# Patient Record
Sex: Male | Born: 1955
Health system: Southern US, Community
[De-identification: ages and names within clinical notes are randomized; demographics above are authoritative.]

## PROBLEM LIST (undated history)

## (undated) DIAGNOSIS — E785 Hyperlipidemia, unspecified: Secondary | ICD-10-CM

## (undated) DIAGNOSIS — G4730 Sleep apnea, unspecified: Secondary | ICD-10-CM

## (undated) DIAGNOSIS — E039 Hypothyroidism, unspecified: Secondary | ICD-10-CM

## (undated) DIAGNOSIS — F32A Depression, unspecified: Secondary | ICD-10-CM

## (undated) DIAGNOSIS — N200 Calculus of kidney: Secondary | ICD-10-CM

## (undated) DIAGNOSIS — F329 Major depressive disorder, single episode, unspecified: Secondary | ICD-10-CM

## (undated) DIAGNOSIS — S86019A Strain of unspecified Achilles tendon, initial encounter: Secondary | ICD-10-CM

## (undated) DIAGNOSIS — K219 Gastro-esophageal reflux disease without esophagitis: Secondary | ICD-10-CM

## (undated) DIAGNOSIS — M1990 Unspecified osteoarthritis, unspecified site: Secondary | ICD-10-CM

## (undated) DIAGNOSIS — F419 Anxiety disorder, unspecified: Secondary | ICD-10-CM

## (undated) DIAGNOSIS — I10 Essential (primary) hypertension: Secondary | ICD-10-CM

## (undated) DIAGNOSIS — R109 Unspecified abdominal pain: Secondary | ICD-10-CM

## (undated) DIAGNOSIS — G4700 Insomnia, unspecified: Secondary | ICD-10-CM

## (undated) HISTORY — DX: Unspecified osteoarthritis, unspecified site: M19.90

## (undated) HISTORY — DX: Essential (primary) hypertension: I10

## (undated) HISTORY — DX: Gastro-esophageal reflux disease without esophagitis: K21.9

## (undated) HISTORY — DX: Depression, unspecified: F32.A

## (undated) HISTORY — DX: Sleep apnea, unspecified: G47.30

## (undated) HISTORY — PX: TONSILLECTOMY: SUR1361

## (undated) HISTORY — DX: Insomnia, unspecified: G47.00

## (undated) HISTORY — DX: Hypothyroidism, unspecified: E03.9

## (undated) HISTORY — DX: Major depressive disorder, single episode, unspecified: F32.9

## (undated) HISTORY — DX: Unspecified abdominal pain: R10.9

## (undated) HISTORY — DX: Calculus of kidney: N20.0

## (undated) HISTORY — DX: Strain of unspecified achilles tendon, initial encounter: S86.019A

## (undated) HISTORY — DX: Anxiety disorder, unspecified: F41.9

## (undated) HISTORY — PX: LITHOTRIPSY: SUR834

## (undated) HISTORY — PX: COLONOSCOPY: SHX174

## (undated) HISTORY — DX: Hyperlipidemia, unspecified: E78.5

## (undated) HISTORY — PX: THYROIDECTOMY: SHX17

---

## 2004-01-28 ENCOUNTER — Emergency Department (HOSPITAL_COMMUNITY): Admission: EM | Admit: 2004-01-28 | Discharge: 2004-01-28 | Payer: Self-pay

## 2004-02-15 ENCOUNTER — Ambulatory Visit (HOSPITAL_COMMUNITY): Admission: RE | Admit: 2004-02-15 | Discharge: 2004-02-15 | Payer: Self-pay | Admitting: Urology

## 2004-09-28 IMAGING — CR DG ABDOMEN 1V
1 series · 1 of 1 positions shown · non-contrast
Comparison: none

CLINICAL DATA: Right ureteropelvic junction stone.
 SINGLE-VIEW ABDOMEN
 A single view of the abdomen is made and compared to a previous CT scan of 01/28/04 and shows again a stone measuring approximately 3 x 7 mm in the region of the right ureteropelvic junction.  There is a left pelvic phlebolith seen.  The kidneys appear normal in size and contour.  The abdominal gas pattern and bones of the pelvis and lumbosacral spine appear normal in this single view. 
 IMPRESSION
 Right ureteropelvic junction stone appears to be in approximately the same place it was on the CT scan of 01/28/04.

[view not recorded]
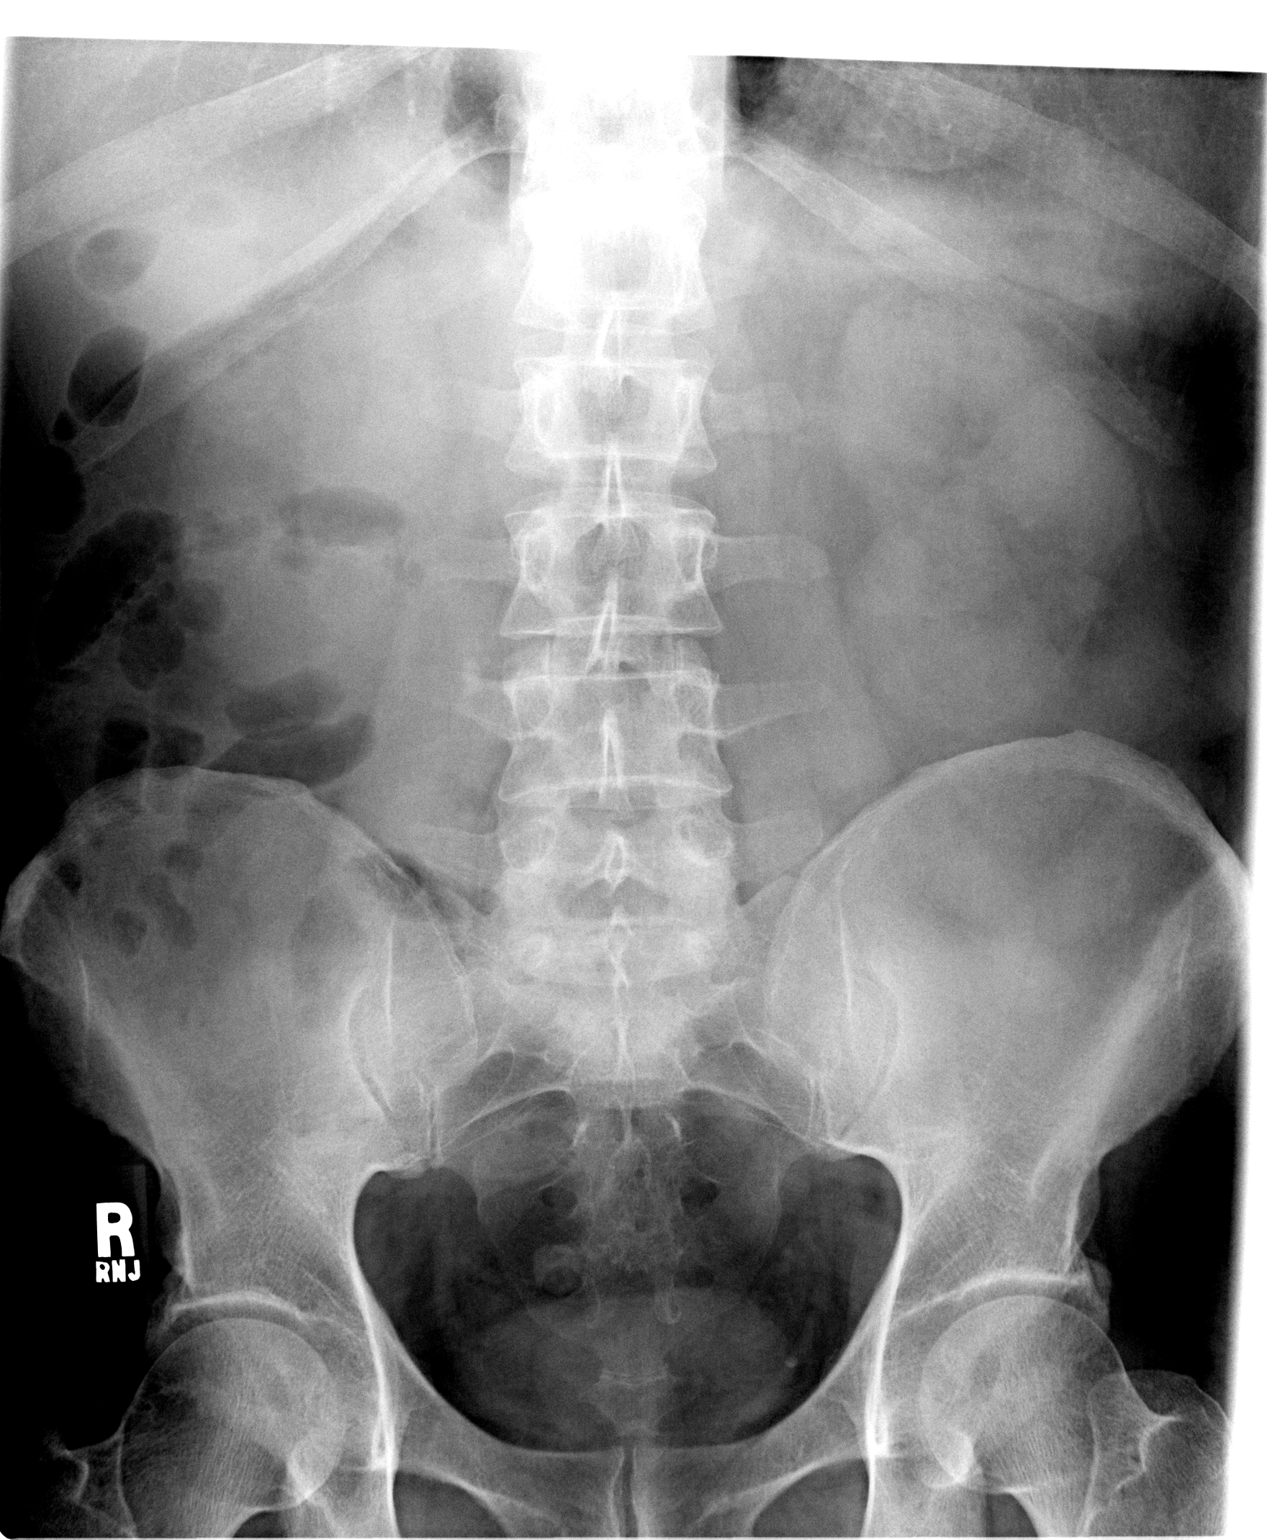

[1 of 1 positions shown; findings below may reference images not displayed]

## 2004-10-11 ENCOUNTER — Ambulatory Visit: Payer: Self-pay | Admitting: Family Medicine

## 2004-10-22 ENCOUNTER — Ambulatory Visit: Payer: Self-pay | Admitting: Family Medicine

## 2004-11-06 ENCOUNTER — Ambulatory Visit: Payer: Self-pay | Admitting: Family Medicine

## 2004-11-08 ENCOUNTER — Ambulatory Visit: Payer: Self-pay | Admitting: Endocrinology

## 2004-11-29 ENCOUNTER — Ambulatory Visit (HOSPITAL_COMMUNITY): Admission: RE | Admit: 2004-11-29 | Discharge: 2004-11-29 | Payer: Self-pay | Admitting: Endocrinology

## 2005-03-27 ENCOUNTER — Ambulatory Visit: Payer: Self-pay | Admitting: Family Medicine

## 2006-02-11 ENCOUNTER — Ambulatory Visit: Payer: Self-pay | Admitting: Family Medicine

## 2006-02-17 ENCOUNTER — Ambulatory Visit: Payer: Self-pay | Admitting: Family Medicine

## 2006-03-04 ENCOUNTER — Ambulatory Visit: Payer: Self-pay | Admitting: Family Medicine

## 2006-03-10 ENCOUNTER — Ambulatory Visit: Payer: Self-pay | Admitting: Cardiology

## 2006-04-16 ENCOUNTER — Ambulatory Visit (HOSPITAL_COMMUNITY): Admission: RE | Admit: 2006-04-16 | Discharge: 2006-04-16 | Payer: Self-pay | Admitting: Urology

## 2007-05-05 ENCOUNTER — Ambulatory Visit: Payer: Self-pay | Admitting: Family Medicine

## 2007-05-05 LAB — CONVERTED CEMR LAB
ALT: 41 units/L — ABNORMAL HIGH (ref 0–40)
Albumin: 4.2 g/dL (ref 3.5–5.2)
Basophils Absolute: 0 10*3/uL (ref 0.0–0.1)
CO2: 32 meq/L (ref 19–32)
Chloride: 109 meq/L (ref 96–112)
Cholesterol: 200 mg/dL (ref 0–200)
Creatinine, Ser: 0.9 mg/dL (ref 0.4–1.5)
GFR calc Af Amer: 115 mL/min
GFR calc non Af Amer: 95 mL/min
HCT: 46.1 % (ref 39.0–52.0)
HDL: 36.5 mg/dL — ABNORMAL LOW (ref >39.0)
LDL Cholesterol: 135 mg/dL — ABNORMAL HIGH (ref 0–99)
Lymphocytes Relative: 35.5 % (ref 12.0–46.0)
Neutro Abs: 3.5 10*3/uL (ref 1.4–7.7)
Potassium: 4 meq/L (ref 3.5–5.1)
RDW: 12.1 % (ref 11.5–14.6)
Sodium: 144 meq/L (ref 135–145)
TSH: 1.15 microintl units/mL (ref 0.35–5.50)
Total Bilirubin: 1.2 mg/dL (ref 0.3–1.2)
VLDL: 28 mg/dL (ref 0–40)
WBC: 6.4 10*3/uL (ref 4.5–10.5)

## 2007-05-11 ENCOUNTER — Ambulatory Visit: Payer: Self-pay | Admitting: Family Medicine

## 2007-06-21 ENCOUNTER — Encounter: Payer: Self-pay | Admitting: Family Medicine

## 2007-06-21 ENCOUNTER — Ambulatory Visit (HOSPITAL_BASED_OUTPATIENT_CLINIC_OR_DEPARTMENT_OTHER): Admission: RE | Admit: 2007-06-21 | Discharge: 2007-06-21 | Payer: Self-pay | Admitting: Family Medicine

## 2007-06-30 ENCOUNTER — Ambulatory Visit: Payer: Self-pay | Admitting: Pulmonary Disease

## 2007-07-14 ENCOUNTER — Encounter: Payer: Self-pay | Admitting: Family Medicine

## 2007-07-20 ENCOUNTER — Telehealth (INDEPENDENT_AMBULATORY_CARE_PROVIDER_SITE_OTHER): Payer: Self-pay | Admitting: *Deleted

## 2007-07-28 ENCOUNTER — Telehealth: Payer: Self-pay | Admitting: Family Medicine

## 2007-08-11 ENCOUNTER — Ambulatory Visit: Payer: Self-pay | Admitting: Pulmonary Disease

## 2007-08-18 ENCOUNTER — Telehealth: Payer: Self-pay | Admitting: Family Medicine

## 2007-08-19 ENCOUNTER — Ambulatory Visit: Payer: Self-pay | Admitting: Family Medicine

## 2007-08-19 DIAGNOSIS — E039 Hypothyroidism, unspecified: Secondary | ICD-10-CM

## 2007-08-19 DIAGNOSIS — J209 Acute bronchitis, unspecified: Secondary | ICD-10-CM

## 2007-08-19 DIAGNOSIS — I10 Essential (primary) hypertension: Secondary | ICD-10-CM

## 2007-08-19 DIAGNOSIS — F329 Major depressive disorder, single episode, unspecified: Secondary | ICD-10-CM

## 2007-08-19 DIAGNOSIS — E785 Hyperlipidemia, unspecified: Secondary | ICD-10-CM

## 2007-08-19 DIAGNOSIS — F419 Anxiety disorder, unspecified: Secondary | ICD-10-CM | POA: Insufficient documentation

## 2007-08-19 DIAGNOSIS — Z87442 Personal history of urinary calculi: Secondary | ICD-10-CM

## 2007-08-30 ENCOUNTER — Encounter: Payer: Self-pay | Admitting: Family Medicine

## 2007-09-13 ENCOUNTER — Ambulatory Visit: Payer: Self-pay | Admitting: Pulmonary Disease

## 2007-11-09 ENCOUNTER — Telehealth: Payer: Self-pay | Admitting: Family Medicine

## 2007-11-10 ENCOUNTER — Encounter: Payer: Self-pay | Admitting: Family Medicine

## 2008-05-30 ENCOUNTER — Telehealth: Payer: Self-pay | Admitting: Family Medicine

## 2008-06-06 ENCOUNTER — Telehealth: Payer: Self-pay | Admitting: Family Medicine

## 2008-06-23 ENCOUNTER — Ambulatory Visit: Payer: Self-pay | Admitting: Family Medicine

## 2008-06-23 LAB — CONVERTED CEMR LAB
Blood in Urine, dipstick: NEGATIVE
Glucose, Urine, Semiquant: NEGATIVE
Ketones, urine, test strip: NEGATIVE
Nitrite: NEGATIVE
Specific Gravity, Urine: 1.015
Urobilinogen, UA: 0.2
WBC Urine, dipstick: NEGATIVE
pH: 6.5

## 2008-07-13 ENCOUNTER — Ambulatory Visit: Payer: Self-pay | Admitting: Family Medicine

## 2008-07-13 DIAGNOSIS — K219 Gastro-esophageal reflux disease without esophagitis: Secondary | ICD-10-CM

## 2008-07-13 DIAGNOSIS — G4700 Insomnia, unspecified: Secondary | ICD-10-CM

## 2008-07-13 DIAGNOSIS — M940 Chondrocostal junction syndrome [Tietze]: Secondary | ICD-10-CM

## 2008-07-13 DIAGNOSIS — K6289 Other specified diseases of anus and rectum: Secondary | ICD-10-CM

## 2008-07-13 LAB — CONVERTED CEMR LAB
ALT: 69 units/L — ABNORMAL HIGH (ref 0–53)
BUN: 14 mg/dL (ref 6–23)
Calcium: 9.2 mg/dL (ref 8.4–10.5)
Chloride: 107 meq/L (ref 96–112)
Cholesterol: 152 mg/dL (ref 0–200)
Creatinine, Ser: 0.9 mg/dL (ref 0.4–1.5)
Eosinophils Absolute: 0.1 10*3/uL (ref 0.0–0.7)
Eosinophils Relative: 2.2 % (ref 0.0–5.0)
GFR calc non Af Amer: 95 mL/min
Glucose, Bld: 89 mg/dL (ref 70–99)
HDL: 31.7 mg/dL — ABNORMAL LOW (ref >39.0)
Hemoglobin: 16.3 g/dL (ref 13.0–17.0)
Lymphocytes Relative: 39.5 % (ref 12.0–46.0)
MCV: 94.1 fL (ref 78.0–100.0)
Monocytes Absolute: 0.3 10*3/uL (ref 0.1–1.0)
Neutro Abs: 2.9 10*3/uL (ref 1.4–7.7)
Platelets: 226 10*3/uL (ref 150–400)
Potassium: 3.8 meq/L (ref 3.5–5.1)
Sodium: 143 meq/L (ref 135–145)
TSH: 0.67 microintl units/mL (ref 0.35–5.50)
VLDL: 23 mg/dL (ref 0–40)

## 2008-07-26 ENCOUNTER — Ambulatory Visit: Payer: Self-pay | Admitting: Gastroenterology

## 2008-07-27 ENCOUNTER — Telehealth: Payer: Self-pay | Admitting: Gastroenterology

## 2008-07-27 ENCOUNTER — Encounter: Payer: Self-pay | Admitting: Gastroenterology

## 2008-08-09 ENCOUNTER — Ambulatory Visit: Payer: Self-pay | Admitting: Gastroenterology

## 2008-08-09 LAB — HM COLONOSCOPY

## 2009-02-13 ENCOUNTER — Ambulatory Visit: Payer: Self-pay | Admitting: Family Medicine

## 2009-02-13 DIAGNOSIS — S96819A Strain of other specified muscles and tendons at ankle and foot level, unspecified foot, initial encounter: Secondary | ICD-10-CM

## 2009-02-13 DIAGNOSIS — S93499A Sprain of other ligament of unspecified ankle, initial encounter: Secondary | ICD-10-CM | POA: Insufficient documentation

## 2009-07-11 ENCOUNTER — Ambulatory Visit: Payer: Self-pay | Admitting: Family Medicine

## 2009-07-26 LAB — CONVERTED CEMR LAB
ALT: 66 units/L — ABNORMAL HIGH (ref 0–53)
AST: 37 units/L (ref 0–37)
Alkaline Phosphatase: 68 units/L (ref 39–117)
BUN: 15 mg/dL (ref 6–23)
Basophils Relative: 0.3 % (ref 0.0–3.0)
CO2: 30 meq/L (ref 19–32)
Chloride: 107 meq/L (ref 96–112)
Eosinophils Absolute: 0.2 10*3/uL (ref 0.0–0.7)
GFR calc non Af Amer: 74.67 mL/min (ref >60)
HCT: 47.4 % (ref 39.0–52.0)
Hemoglobin: 16.4 g/dL (ref 13.0–17.0)
LDL Cholesterol: 104 mg/dL — ABNORMAL HIGH (ref 0–99)
Lymphs Abs: 2.3 10*3/uL (ref 0.7–4.0)
MCHC: 34.5 g/dL (ref 30.0–36.0)
MCV: 95.8 fL (ref 78.0–100.0)
Monocytes Absolute: 0.4 10*3/uL (ref 0.1–1.0)
Neutrophils Relative %: 53.6 % (ref 43.0–77.0)
Platelets: 196 10*3/uL (ref 150.0–400.0)
Sodium: 143 meq/L (ref 135–145)
TSH: 1.17 microintl units/mL (ref 0.35–5.50)
Total Bilirubin: 1.9 mg/dL — ABNORMAL HIGH (ref 0.3–1.2)
VLDL: 20.4 mg/dL (ref 0.0–40.0)
WBC: 6.2 10*3/uL (ref 4.5–10.5)

## 2009-08-01 ENCOUNTER — Ambulatory Visit: Payer: Self-pay | Admitting: Family Medicine

## 2009-08-01 LAB — CONVERTED CEMR LAB
Bilirubin Urine: NEGATIVE
Glucose, Urine, Semiquant: NEGATIVE
Ketones, urine, test strip: NEGATIVE
Nitrite: NEGATIVE
Specific Gravity, Urine: 1.02
Urobilinogen, UA: 0.2
WBC Urine, dipstick: NEGATIVE
pH: 5

## 2009-08-06 ENCOUNTER — Telehealth: Payer: Self-pay | Admitting: Family Medicine

## 2009-08-08 ENCOUNTER — Telehealth: Payer: Self-pay | Admitting: Family Medicine

## 2009-09-05 ENCOUNTER — Telehealth: Payer: Self-pay | Admitting: Family Medicine

## 2009-12-12 ENCOUNTER — Ambulatory Visit: Payer: Self-pay | Admitting: Family Medicine

## 2009-12-12 DIAGNOSIS — M479 Spondylosis, unspecified: Secondary | ICD-10-CM

## 2009-12-14 ENCOUNTER — Ambulatory Visit: Payer: Self-pay | Admitting: Family Medicine

## 2009-12-17 ENCOUNTER — Telehealth: Payer: Self-pay | Admitting: Family Medicine

## 2010-07-16 ENCOUNTER — Ambulatory Visit: Payer: Self-pay | Admitting: Family Medicine

## 2010-07-16 ENCOUNTER — Encounter: Payer: Self-pay | Admitting: Family Medicine

## 2010-07-16 DIAGNOSIS — R0602 Shortness of breath: Secondary | ICD-10-CM

## 2010-07-16 DIAGNOSIS — R002 Palpitations: Secondary | ICD-10-CM

## 2010-07-16 LAB — CONVERTED CEMR LAB
Albumin: 4.4 g/dL (ref 3.5–5.2)
Basophils Absolute: 0 10*3/uL (ref 0.0–0.1)
Basophils Relative: 0.5 % (ref 0.0–3.0)
Bilirubin, Direct: 0.2 mg/dL (ref 0.0–0.3)
Calcium: 9.2 mg/dL (ref 8.4–10.5)
Eosinophils Absolute: 0.2 10*3/uL (ref 0.0–0.7)
Eosinophils Relative: 2.1 % (ref 0.0–5.0)
Glucose, Bld: 75 mg/dL (ref 70–99)
HCT: 48.2 % (ref 39.0–52.0)
Lymphocytes Relative: 33.4 % (ref 12.0–46.0)
Lymphs Abs: 2.6 10*3/uL (ref 0.7–4.0)
MCV: 95.4 fL (ref 78.0–100.0)
Monocytes Absolute: 0.5 10*3/uL (ref 0.1–1.0)
Monocytes Relative: 5.9 % (ref 3.0–12.0)
Neutro Abs: 4.6 10*3/uL (ref 1.4–7.7)
Potassium: 4.8 meq/L (ref 3.5–5.1)
RDW: 13.4 % (ref 11.5–14.6)
Sodium: 139 meq/L (ref 135–145)
Total CHOL/HDL Ratio: 5
Triglycerides: 167 mg/dL — ABNORMAL HIGH (ref 0.0–149.0)
VLDL: 33.4 mg/dL (ref 0.0–40.0)

## 2010-08-28 ENCOUNTER — Ambulatory Visit: Payer: Self-pay | Admitting: Cardiovascular Disease

## 2010-10-29 ENCOUNTER — Ambulatory Visit: Payer: Self-pay | Admitting: Family Medicine

## 2010-10-29 LAB — CONVERTED CEMR LAB
Bilirubin Urine: NEGATIVE
Glucose, Urine, Semiquant: NEGATIVE
Specific Gravity, Urine: 1.025
pH: 5

## 2010-11-18 LAB — CONVERTED CEMR LAB
ALT: 76 units/L — ABNORMAL HIGH (ref 0–53)
AST: 40 units/L — ABNORMAL HIGH (ref 0–37)
Albumin: 4.5 g/dL (ref 3.5–5.2)
BUN: 15 mg/dL (ref 6–23)
Chloride: 104 meq/L (ref 96–112)
Cholesterol: 183 mg/dL (ref 0–200)
Eosinophils Relative: 3 % (ref 0.0–5.0)
Glucose, Bld: 85 mg/dL (ref 70–99)
HCT: 48 % (ref 39.0–52.0)
Hemoglobin: 16.5 g/dL (ref 13.0–17.0)
Lymphs Abs: 2.6 10*3/uL (ref 0.7–4.0)
MCV: 94.9 fL (ref 78.0–100.0)
Monocytes Absolute: 0.4 10*3/uL (ref 0.1–1.0)
Neutro Abs: 3.8 10*3/uL (ref 1.4–7.7)
PSA: 1.05 ng/mL (ref 0.10–4.00)
Platelets: 208 10*3/uL (ref 150.0–400.0)
Potassium: 4.7 meq/L (ref 3.5–5.1)
RDW: 13 % (ref 11.5–14.6)
Sodium: 142 meq/L (ref 135–145)
TSH: 2.05 microintl units/mL (ref 0.35–5.50)
Total Bilirubin: 1.5 mg/dL — ABNORMAL HIGH (ref 0.3–1.2)

## 2010-11-19 ENCOUNTER — Encounter: Payer: Self-pay | Admitting: Family Medicine

## 2010-11-21 ENCOUNTER — Ambulatory Visit
Admission: RE | Admit: 2010-11-21 | Discharge: 2010-11-21 | Payer: Self-pay | Source: Home / Self Care | Attending: Family Medicine | Admitting: Family Medicine

## 2010-11-21 DIAGNOSIS — H60399 Other infective otitis externa, unspecified ear: Secondary | ICD-10-CM | POA: Insufficient documentation

## 2010-11-22 ENCOUNTER — Telehealth: Payer: Self-pay | Admitting: Family Medicine

## 2010-12-07 ENCOUNTER — Encounter: Payer: Self-pay | Admitting: Endocrinology

## 2010-12-17 NOTE — Progress Notes (Signed)
Summary: diagnosis  Phone Note Call from Patient   Caller: Patient Call For: Judithann Sheen MD Summary of Call: [phone] Pt given his diagnosis so he can research it online.  Initial call taken by: Lynann Beaver CMA,  December 17, 2009 9:40 AM

## 2010-12-17 NOTE — Assessment & Plan Note (Signed)
Summary: CHEST CONGESTION/NJR   Vital Signs:  Patient profile:   55 year old male Height:      68.5 inches (173.99 cm) Weight:      195 pounds (88.64 kg) O2 Sat:      98 % on Room air Temp:     98.8 degrees F (37.11 degrees C) oral Pulse rate:   89 / minute BP sitting:   132 / 82  (left arm)  Vitals Entered By: Josph Macho RMA (July 16, 2010 11:51 AM)  O2 Flow:  Room air CC: SOB/ Chest congestion/ CF, Back Pain Is Patient Diabetic? No   History of Present Illness: Patient in today for evaluation of some episodes of a sense of SOB. These episodes have been notable for about a week. they occur while he is at his desk at work. They have never awoken him at night. He reports getting a sense he is not getting enough breath so then he pays attention and he notes then that he can feel his heart flutter and skip a beat at times as well. He is denying CP/nausea/diaphoresis. He is a nonsmoker, but he does have a brother in his 22s who has already had cardiac stenting. That brother is morbidly obese and smoke. The patient himself works at a very hi stress job for [employer] and notes stress has been worse lately. He works long hours and often gets home late and eats, then goes to bed in short order. He denies any recent heartburn or dyspepsia. He has multiple concerning risk factors for heart disease including Sleep Apnea using CPAP, hypothyroidism, HTN, hyperlipidemia, sedentary lifestyle, hi stress job, elevated BMI. He also notes he sleeps less than 7 hours nightly and has trouble falling asleep. He has taken Ambien CR in past and it helps him fall asleep but leaves him groggy in the am. No recent illness/f/c/HA/congestion/rhinorrhea/pharyngitis/GI or GU c/o.   Current Medications (verified): 1)  Tramadol Hcl 50 Mg  Tabs (Tramadol Hcl) .... Take 1-2 Tabs Q4-6h As Needed Pain 2)  Synthroid 112 Mcg Tabs (Levothyroxine Sodium) .Marland Kitchen.. 1 By Mouth Once Daily 3)  Zocor 80 Mg Tabs (Simvastatin) .Marland Kitchen.. 1 By  Mouth Once Daily At Bedtime 4)  Diovan 80 Mg Tabs (Valsartan) .Marland Kitchen.. 1 By Mouth Once Daily 5)  Adult Aspirin Ec Low Strength 81 Mg  Tbec (Aspirin) .Marland Kitchen.. 1 By Mouth Once Daily 6)  Diclofenac Sodium 75 Mg  Tbec (Diclofenac Sodium) .Marland Kitchen.. 1 By Mouth Two Times A Day After Meals 7)  Analpram-Hc Singles 1-2.5 % Crea (Hydrocortisone Ace-Pramoxine) .... Insert I Applicator Am and Pm 8)  Protonix 40 Mg Tbec (Pantoprazole Sodium) .Marland Kitchen.. 1 By Mouth Once Daily 9)  Temazepam 30 Mg Caps (Temazepam) .Marland Kitchen.. 1 By Mouth Hs As Needed Sleep 10)  Prednisone 20 Mg Tabs (Prednisone) .... One T.i.d. P.c. X3 Days Then One B.i.d. P.c. X6 Months Then One Q.d. For Neck Problem 11)  Amoxicillin 500 Mg Caps (Amoxicillin) .... One Stat Then One Morning Midafternoon and Bedtime For Infection  Allergies (verified): No Known Drug Allergies  Past History:  Past medical history reviewed for relevance to current acute and chronic problems. Social history (including risk factors) reviewed for relevance to current acute and chronic problems.  Past Medical History: Reviewed history from 08/19/2007 and no changes required. Depression Hyperlipidemia Hypertension Hypothyroidism  Social History: Reviewed history and no changes required.  Review of Systems      See HPI  Physical Exam  General:  Well-developed,well-nourished,in no acute distress;  alert,appropriate and cooperative throughout examination Head:  Normocephalic and atraumatic without obvious abnormalities. No apparent alopecia or balding. Eyes:  No corneal or conjunctival inflammation noted. EOMI.  Ears:  b/l TMs scarred and opacified L>R. no erythema or fluid noted Nose:  External nasal examination shows no deformity or inflammation. Nasal mucosa are pink and moist without lesions or exudates. Mouth:  Oral mucosa and oropharynx without lesions or exudates.  Teeth in good repair. Neck:  No deformities, masses, or tenderness noted. Lungs:  Normal respiratory effort,  chest expands symmetrically. Lungs are clear to auscultation, no crackles or wheezes. Heart:  Normal rate and regular rhythm. S1 and S2 normal without gallop, murmur, click, rub or other extra sounds. 1/6 Murmur noted at mid axillary line on left   Impression & Recommendations:  Problem # 1:  PALPITATIONS (ICD-785.1)  Orders: Cardiology Referral (Cardiology) TLB-CBC Platelet - w/Differential (85025-CBCD) TLB-Hepatic/Liver Function Pnl (80076-HEPATIC) TLB-TSH (Thyroid Stimulating Hormone) (84443-TSH) Venipuncture (16109) With SOB and multiple rish factors  Given NTG to keep on his person and referred to cardiology for further evaluation  Problem # 2:  HYPOTHYROIDISM (ICD-244.9)  His updated medication list for this problem includes:    Synthroid 112 Mcg Tabs (Levothyroxine sodium) .Marland Kitchen... 1 by mouth once daily  Orders: Cardiology Referral (Cardiology) Check TSH  Problem # 3:  INSOMNIA (ICD-780.52)  The following medications were removed from the medication list:    Temazepam 30 Mg Caps (Temazepam) .Marland Kitchen... 1 by mouth hs as needed sleep His updated medication list for this problem includes:    Zolpidem Tartrate 10 Mg Tabs (Zolpidem tartrate) .Marland Kitchen... 1/2 to 1 tab by mouth at bedtime as needed insomnia  Problem # 4:  DEPRESSION (ICD-311)  His updated medication list for this problem includes:    Alprazolam 0.25 Mg Tabs (Alprazolam) .Marland Kitchen... 1/2 to 1 tab by mouth two times a day as needed anxiety/sob/insomnia. Has been doing well but is under increased stress, will try Alprazolam as needed to see if it is helpful reeval at next visit in 1 month  Problem # 5:  HYPERLIPIDEMIA (ICD-272.4)  His updated medication list for this problem includes:    Zocor 80 Mg Tabs (Simvastatin) .Marland Kitchen... 1 by mouth once daily at bedtime  Orders: Cardiology Referral (Cardiology) TLB-Lipid Panel (80061-LIPID) Will decrease Simvastatin to 40mg  at bedtime vs recent guidelines, await cholesterol panel  Problem #  6:  GERD (ICD-530.81)  His updated medication list for this problem includes:    Protonix 40 Mg Tbec (Pantoprazole sodium) .Marland Kitchen... 1 by mouth once daily Avoid offending foods and minimize serving sizes  Complete Medication List: 1)  Tramadol Hcl 50 Mg Tabs (Tramadol hcl) .... Take 1-2 tabs q4-6h as needed pain 2)  Synthroid 112 Mcg Tabs (Levothyroxine sodium) .Marland Kitchen.. 1 by mouth once daily 3)  Zocor 80 Mg Tabs (Simvastatin) .Marland Kitchen.. 1 by mouth once daily at bedtime 4)  Diovan 80 Mg Tabs (Valsartan) .Marland Kitchen.. 1 by mouth once daily 5)  Adult Aspirin Ec Low Strength 81 Mg Tbec (Aspirin) .Marland Kitchen.. 1 by mouth once daily 6)  Diclofenac Sodium 75 Mg Tbec (Diclofenac sodium) .Marland Kitchen.. 1 by mouth two times a day after meals 7)  Analpram-hc Singles 1-2.5 % Crea (Hydrocortisone ace-pramoxine) .... Insert i applicator am and pm 8)  Protonix 40 Mg Tbec (Pantoprazole sodium) .Marland Kitchen.. 1 by mouth once daily 9)  Prednisone 20 Mg Tabs (Prednisone) .... One t.i.d. p.c. x3 days then one b.i.d. p.c. x6 months then one q.d. for neck problem 10)  Amoxicillin  500 Mg Caps (Amoxicillin) .... One stat then one morning midafternoon and bedtime for infection 11)  Zolpidem Tartrate 10 Mg Tabs (Zolpidem tartrate) .... 1/2 to 1 tab by mouth at bedtime as needed insomnia 12)  Nitrostat 0.4 Mg Subl (Nitroglycerin) .Marland Kitchen.. 1 tab sl as needed cp, repeat q 5 minutes x 3 doses or until pain relief. if no pain relief to er for evaluation 13)  Alprazolam 0.25 Mg Tabs (Alprazolam) .... 1/2 to 1 tab by mouth two times a day as needed anxiety/sob/insomnia  Other Orders: TLB-Renal Function Panel (80069-RENAL) TLB-PSA (Prostate Specific Antigen) (84153-PSA) Specimen Handling (04540)  Patient Instructions: 1)  Please schedule a follow-up appointment in 1 month or sooner if any concerns 2)  Will schedule cardiology appointment and notify you of appt. 3)  Avoid spicy/fatty/acidic foods especially close to bed time. 4)  Try Alprazolam when stress and SOB are  notable. 5)  Take NTG with you at all times until cardiac evaluation complete. 6)  Minimize caffeine and sodium 7)  Try the Zolpidem as needed if sleep continues to be difficult to obtain Prescriptions: ALPRAZOLAM 0.25 MG TABS (ALPRAZOLAM) 1/2 to 1 tab by mouth two times a day as needed anxiety/SOB/insomnia  #10 x 0   Entered and Authorized by:   Danise Edge MD   Signed by:   Danise Edge MD on 07/16/2010   Method used:   Print then Give to Patient   RxID:   [id number] NITROSTAT 0.4 MG SUBL (NITROGLYCERIN) 1 tab sl as needed CP, repeat q 5 minutes x 3 doses or until pain relief. If no pain relief to ER for evaluation  #25 x 1   Entered and Authorized by:   Danise Edge MD   Signed by:   Danise Edge MD on 07/16/2010   Method used:   Electronically to        [facility] [phone]* (retail)       [address] [place], [place]  [address]       Ph: [phone] or [phone]       Fax: [phone]   RxID:   [id number] ZOLPIDEM TARTRATE 10 MG TABS (ZOLPIDEM TARTRATE) 1/2 to 1 tab by mouth at bedtime as needed insomnia  #15 x 1   Entered and Authorized by:   Danise Edge MD   Signed by:   Danise Edge MD on 07/16/2010   Method used:   Print then Give to Patient   RxID:   [id number]   Appended Document: CHEST CONGESTION/NJR notify labs all look good but TG are a little hi and his dose of Simvastatin is too hi per recent recommendations. Would have him decrease his Simvastatin to 40mg  once daily and add 2 fish or flaxseed oil caps by mouth daily, recheck FLP and lft in 3 months. Give him 30 tabs with 3 rf of the Simvastatin.  Appended Document: CHEST CONGESTION/NJR Left message for pt to return my call.  Appended Document: CHEST CONGESTION/NJR    Prescriptions: ZOCOR 40 MG TABS (SIMVASTATIN) once daily  #30 x 1   Entered by:   Josph Macho RMA   Authorized by:   Danise Edge MD   Signed by:   Josph Macho RMA on 07/17/2010   Method  used:   Electronically to        [facility] [phone]* (retail)       [phone]  [address]       [place], [place]  [address]       Ph: [phone] or [phone]       Fax: [phone]   RxID:   [id number]

## 2010-12-17 NOTE — Assessment & Plan Note (Signed)
Summary: np6/palps/sob/htn/hyperlipidemia/hypothyrodism   Visit Type:  Initial Consult Referring Provider:  Blyth,Stacey Primary Provider:  Judithann Sheen MD  CC:  c/o palpitations, shortness of breath, HTN, and hyperlipidemia and hypothyroidism.Marland Kitchen  History of Present Illness: Micheal Jones is a very pleasant 55 year old gentleman with a history of obstructive sleep apnea was on CPAP, hyperlipidemia, hypertension, sedentary lifestyle with a family history of coronary artery disease on his brother and mother side, who presents for evaluation of recent episodes of shortness of breath at rest and some arm numbness.  He reports that he has a very stressful job at [employer]. He has carpets calls a high stress. 3 weeks ago, he was on a conference call and was significantly stressed. He had difficulty catching his breath. This happened again several days later. It has happened at least 3 or 4 times total. He does not have any symptoms with exertion.   He also reports having several episodes of numbness and tingling in his arms when he wakes up that has happened on several occasions. He has not been participating in exercise regiment though reports no symptoms when he exerts himself.  EKG shows normal sinus rhythm with rate of 95 beats per minute, no significant ST or T wave changes  Total cholesterol 180, LDL 111 on simvastatin 80 mg daily  Preventive Screening-Counseling & Management  Alcohol-Tobacco     Smoking Status: never  Caffeine-Diet-Exercise     Does Patient Exercise: yes      Drug Use:  no.    Current Medications (verified): 1)  Tramadol Hcl 50 Mg  Tabs (Tramadol Hcl) .... Take 1-2 Tabs Q4-6h As Needed Pain 2)  Synthroid 112 Mcg Tabs (Levothyroxine Sodium) .Marland Kitchen.. 1 By Mouth Once Daily 3)  Zocor 40 Mg Tabs (Simvastatin) .... Once Daily 4)  Diovan 80 Mg Tabs (Valsartan) .Marland Kitchen.. 1 By Mouth Once Daily 5)  Adult Aspirin Ec Low Strength 81 Mg  Tbec (Aspirin) .Marland Kitchen.. 1 By Mouth Once Daily 6)   Diclofenac Sodium 75 Mg  Tbec (Diclofenac Sodium) .Marland Kitchen.. 1 By Mouth Two Times A Day After Meals 7)  Analpram-Hc Singles 1-2.5 % Crea (Hydrocortisone Ace-Pramoxine) .... Insert I Applicator Am and Pm 8)  Protonix 40 Mg Tbec (Pantoprazole Sodium) .Marland Kitchen.. 1 By Mouth Once Daily 9)  Zolpidem Tartrate 10 Mg Tabs (Zolpidem Tartrate) .... 1/2 To 1 Tab By Mouth At Bedtime As Needed Insomnia 10)  Nitrostat 0.4 Mg Subl (Nitroglycerin) .Marland Kitchen.. 1 Tab Sl As Needed Cp, Repeat Q 5 Minutes X 3 Doses or Until Pain Relief. If No Pain Relief To Er For Evaluation 11)  Alprazolam 0.25 Mg Tabs (Alprazolam) .... 1/2 To 1 Tab By Mouth Two Times A Day As Needed Anxiety/sob/insomnia  Allergies (verified): No Known Drug Allergies  Past History:  Past Medical History: Last updated: 08/19/2007 Depression Hyperlipidemia Hypertension Hypothyroidism  Past Surgical History: Last updated: 08/19/2007 Tonsillectomy Thyroidectomy (partial) Lithotripsy  Family History: Last updated: 2010-09-22 Mother: Deceased stroke age 72; CAD; stents x 2 Father: Deceased Siblings: 4 sisters; good health                1 Brother;CAD stent.  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  Regular Exercise - yes Drug Use - no  Risk Factors: Exercise: yes (September 22, 2010)  Risk Factors: Smoking Status: never (Sep 22, 2010)  Family History: Mother: Deceased stroke age 70; CAD; stents x 2 Father: Deceased Siblings: 4 sisters; good health  1 Brother;CAD stent.  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  Regular Exercise - yes Drug Use - no Smoking Status:  never Does Patient Exercise:  yes Drug Use:  no  Review of Systems  The patient denies fever, weight loss, weight gain, vision loss, decreased hearing, hoarseness, chest pain, syncope, dyspnea on exertion, peripheral edema, prolonged cough, abdominal pain, incontinence,  muscle weakness, depression, and enlarged lymph nodes.         Trouble catching his breath at rest, arms numb on occassion  Vital Signs:  Patient profile:   55 year old male Height:      68.5 inches Weight:      198 pounds BMI:     29.78 Pulse rate:   92 / minute BP sitting:   144 / 96  (left arm) Cuff size:   large  Vitals Entered By: Bishop Dublin, CMA (August 28, 2010 10:07 AM)  Physical Exam  General:  Well developed, well nourished, in no acute distress. Head:  normocephalic and atraumatic Neck:  Neck supple, no JVD. No masses, thyromegaly or abnormal cervical nodes. Lungs:  Clear bilaterally to auscultation and percussion. Heart:  Non-displaced PMI, chest non-tender; regular rate and rhythm, S1, S2 without murmurs, rubs or gallops. Carotid upstroke normal, no bruit. Normal abdominal aortic size, no bruits.  Pedals normal pulses. No edema, no varicosities. Abdomen:  Bowel sounds positive; abdomen soft and non-tender without masses Msk:  Back normal, normal gait. Muscle strength and tone normal. Pulses:  pulses normal in all 4 extremities Extremities:  No clubbing or cyanosis. Neurologic:  Alert and oriented x 3. Skin:  Intact without lesions or rashes. Psych:  Normal affect.   Impression & Recommendations:  Problem # 1:  SHORTNESS OF BREATH (ICD-786.05) his episodes of shortness of breath that come on at rest are very atypical. Hair-like for noncardiac. He does not have any significant symptoms with exertion. His EKG is normal. I suggested that he join a gym which is funded by his work. If he has symptoms of chest tightness, shortness of breath or other symptoms concerning for cardiovascular disease, such as sweating, that he call our office for further evaluation.  His updated medication list for this problem includes:    Diovan 80 Mg Tabs (Valsartan) .Marland Kitchen... 1 by mouth once daily    Adult Aspirin Ec Low Strength 81 Mg Tbec (Aspirin) .Marland Kitchen... 1 by mouth once  daily  Problem # 2:  HYPERTENSION (ICD-401.9) Recheck of his blood pressure showed systolic of 130, diastolic of 90. Asked him to monitor his diastolic pressure at work.  His updated medication list for this problem includes:    Diovan 80 Mg Tabs (Valsartan) .Marland Kitchen... 1 by mouth once daily    Adult Aspirin Ec Low Strength 81 Mg Tbec (Aspirin) .Marland Kitchen... 1 by mouth once daily  Problem # 3:  HYPERLIPIDEMIA (ICD-272.4) Given his strong family history, brother who has coronary disease with a stent, I suggested that we try to push his LDL to 100 or less. One option would be to change him to Lipitor 40 mg daily when this comes generic at the end of the year or the beginning of next year. I stressed to him the importance of his weight, diet and some exercise despite his busy lifestyle  His updated medication list for this problem includes:    Zocor 40 Mg Tabs (Simvastatin) ..... Once daily  Patient Instructions: 1)  Your physician recommends that you continue on your  current medications as directed. Please refer to the Current Medication list given to you today. 2)  Your physician wants you to follow-up in:  1 year  You will receive a reminder letter in the mail two months in advance. If you don't receive a letter, please call our office to schedule the follow-up appointment.

## 2010-12-17 NOTE — Assessment & Plan Note (Signed)
Summary: rigth side neck pain comes and goes/njrq/pt rescd//ccm   Vital Signs:  Patient profile:   55 year old male Weight:      189.6 pounds BMI:     28.51 O2 Sat:      98 % Temp:     98.6 degrees F Pulse rate:   88 / minute BP sitting:   124 / 84  (left arm) Cuff size:   large  Vitals Entered By: Pura Spice, RN (December 12, 2009 3:59 PM) CC: pain in neck occurs more right side than left. pain comes and goes and only last about 2 min.  Is Patient Diabetic? No   Contraindications/Deferment of Procedures/Staging:    Test/Procedure: FLU VAX    Reason for deferment: patient declined   History of Present Illness: this 55 year old white male who has been divorced in the past year relates he has had 2-3 episodes of neck pain on the right side which at times radiates into the right shoulder has happened 3 times in the past 2-3 months and at times have been excruciating. The pain has been rather severe 3 times in the past day and has lasted 2-3 minutes and then subsides. Patient uses CPAP machine for sleep apnea hypertension stable  Allergies (verified): No Known Drug Allergies  Past History:  Past Medical History: Last updated: 08/19/2007 Depression Hyperlipidemia Hypertension Hypothyroidism  Past Surgical History: Last updated: 08/19/2007 Tonsillectomy Thyroidectomy (partial) Lithotripsy  Review of Systems  The patient denies anorexia, fever, weight loss, weight gain, vision loss, decreased hearing, hoarseness, chest pain, syncope, dyspnea on exertion, peripheral edema, prolonged cough, headaches, hemoptysis, abdominal pain, melena, hematochezia, severe indigestion/heartburn, hematuria, incontinence, genital sores, muscle weakness, suspicious skin lesions, transient blindness, difficulty walking, depression, unusual weight change, abnormal bleeding, enlarged lymph nodes, angioedema, breast masses, and testicular masses.    Physical Exam  General:   Well-developed,well-nourished,in no acute distress; alert,appropriate and cooperative throughout examination Head:  Normocephalic and atraumatic without obvious abnormalities. No apparent alopecia or balding. Eyes:  No corneal or conjunctival inflammation noted. EOMI. Perrla. Funduscopic exam benign, without hemorrhages, exudates or papilledema. Vision grossly normal. Ears:  External ear exam shows no significant lesions or deformities.  Otoscopic examination reveals clear canals, tympanic membranes are intact bilaterally without bulging, retraction, inflammation or discharge. Hearing is grossly normal bilaterally. Nose:  minimal nasal congestion Mouth:  Oral mucosa and oropharynx without lesions or exudates.  Teeth in good repair. Neck:  marked tenderness right side of the neck C2 to 7 white pain on rotation Chest Wall:  No deformities, masses, tenderness or gynecomastia noted. Lungs:  Normal respiratory effort, chest expands symmetrically. Lungs are clear to auscultation, no crackles or wheezes. Heart:  Normal rate and regular rhythm. S1 and S2 normal without gallop, murmur, click, rub or other extra sounds. Msk:  tenderness right side of the neck C2 to C7 Pain on right rotation of the neck Pulses:  R and L carotid,radial,femoral,dorsalis pedis and posterior tibial pulses are full and equal bilaterally   Impression & Recommendations:  Problem # 1:  ARTHRITIS, CERVICAL SPINE (ICD-721.90) Assessment New  prednisone 20 mg decreasing dosage  Orders: Prescription Created Electronically [phone])  Problem # 2:  NECK PAIN (ICD-723.1) Assessment: New  His updated medication list for this problem includes:    Tramadol Hcl 50 Mg Tabs (Tramadol hcl) .Marland Kitchen... Take 1-2 tabs q4-6h as needed pain    Adult Aspirin Ec Low Strength 81 Mg Tbec (Aspirin) .Marland Kitchen... 1 by mouth once daily  Diclofenac Sodium 75 Mg Tbec (Diclofenac sodium) .Marland Kitchen... 1 by mouth two times a day after meals  Orders: T-Cervical Spine  Comp 4 Views (72050TC)  Problem # 3:  INSOMNIA (ICD-780.52) Assessment: Improved  His updated medication list for this problem includes:    Temazepam 30 Mg Caps (Temazepam) .Marland Kitchen... 1 by mouth hs as needed sleep  Problem # 4:  GERD (ICD-530.81) Assessment: Improved  His updated medication list for this problem includes:    Protonix 40 Mg Tbec (Pantoprazole sodium) .Marland Kitchen... 1 by mouth once daily  Complete Medication List: 1)  Tramadol Hcl 50 Mg Tabs (Tramadol hcl) .... Take 1-2 tabs q4-6h as needed pain 2)  Synthroid 112 Mcg Tabs (Levothyroxine sodium) .Marland Kitchen.. 1 by mouth once daily 3)  Zocor 80 Mg Tabs (Simvastatin) .Marland Kitchen.. 1 by mouth once daily at bedtime 4)  Diovan 80 Mg Tabs (Valsartan) .Marland Kitchen.. 1 by mouth once daily 5)  Adult Aspirin Ec Low Strength 81 Mg Tbec (Aspirin) .Marland Kitchen.. 1 by mouth once daily 6)  Diclofenac Sodium 75 Mg Tbec (Diclofenac sodium) .Marland Kitchen.. 1 by mouth two times a day after meals 7)  Analpram-hc Singles 1-2.5 % Crea (Hydrocortisone ace-pramoxine) .... Insert i applicator am and pm 8)  Protonix 40 Mg Tbec (Pantoprazole sodium) .Marland Kitchen.. 1 by mouth once daily 9)  Temazepam 30 Mg Caps (Temazepam) .Marland Kitchen.. 1 by mouth hs as needed sleep 10)  Prednisone 20 Mg Tabs (Prednisone) .... One t.i.d. p.c. x3 days then one b.i.d. p.c. x6 months then one q.d. for neck problem 11)  Amoxicillin 500 Mg Caps (Amoxicillin) .... One stat then one morning midafternoon and bedtime for infection  Patient Instructions: 1)  To treat that problem with prednisone 20 mg decrease in dosage and while on prednisone stop the diclofenac 2)  B. prescription for amoxicillin for otitis media , this was prescribed O1 29 Prescriptions: AMOXICILLIN 500 MG CAPS (AMOXICILLIN) one stat then one morning midafternoon and bedtime for infection  #30 x 0   Entered and Authorized by:   Judithann Sheen MD   Signed by:   Judithann Sheen MD on 12/15/2009   Method used:   [facility]        [facility] [phone]* (retail)        [address] [place], [place]  [address]       Ph: [phone] or [phone]       Fax: [phone]   RxID:   [id number] PREDNISONE 20 MG TABS (PREDNISONE) one t.i.d. p.c. x3 days then one b.i.d. p.c. x6 months then one q.d. for neck problem  #30 x 0   Entered and Authorized by:   Judithann Sheen MD   Signed by:   Judithann Sheen MD on 12/15/2009   Method used:   [facility] to        [facility] [phone][facility] (retail)       [address] [place], [place]  [address]       Ph: [phone] or [phone]       Fax: [phone]   RxID:   [id number]    Orders Added: 1)  T-Cervical Spine Comp 4 Views [72050TC] 2)  Prescription Created Electronically [G8553] 3)  Est. Patient Level III [42706]

## 2010-12-19 NOTE — Progress Notes (Signed)
Summary: Pt has the WRONG prescriptions  Phone Note Call from Patient   Caller: Patient Call For: Judithann Sheen MD Summary of Call: Pt is calling stating he got the wrong prescriptions yesterday with another pt's name on it.  Please call as he does not know what to take. [phone] Initial call taken by: [facility] CMA AAMA,  November 22, 2010 10:02 AM  Follow-up for Phone Call        Spoke to pt and was given another pt's rx by mistake. Pt is going to mail the rx back to [place] to shread. Pt did recieve his right medications. Follow-up by: Romualdo Bolk, CMA (AAMA),  November 22, 2010 11:08 AM

## 2010-12-19 NOTE — Assessment & Plan Note (Signed)
Summary: cpx//ccm---PT RSC (BMP) // RS----PT RSC (BMP) // RS---PT RSC ...   Vital Signs:  Patient profile:   55 year old male Height:      67 inches Weight:      190 pounds Temp:     98.4 degrees F oral Pulse rate:   86 / minute Pulse rhythm:   regular BP sitting:   114 / 72  (left arm) Cuff size:   large  Vitals Entered By: Alfred Levins, CMA (November 21, 2010 11:33 AM) CC: cpx   History of Present Illness: this 55 year old white voiced male with one teenage daughter is in today for complete physical examination Has a past history of hypothyroid is him which has been controlled with medication also his hypertension has been controlled 114/72 patient has femoral order proctitis which is controlled with Analpram-HC Chronic history of GERD which is well controlled with proton he Is a problem with insomnia which is helped with Ambien He has some anxiety which he has to take alprazolam Hypercholesterolemia is controlled with Zocor He has a past history of cervical spine arthritis for which he needs any disease it has external otitis or complain of itching and pain in the left canal left ear  Current Medications (verified): 1)  Tramadol Hcl 50 Mg  Tabs (Tramadol Hcl) .... Take 1-2 Tabs Q4-6h As Needed Pain 2)  Synthroid 112 Mcg Tabs (Levothyroxine Sodium) .Marland Kitchen.. 1 By Mouth Once Daily 3)  Zocor 40 Mg Tabs (Simvastatin) .... Once Daily 4)  Diovan 80 Mg Tabs (Valsartan) .Marland Kitchen.. 1 By Mouth Once Daily 5)  Adult Aspirin Ec Low Strength 81 Mg  Tbec (Aspirin) .Marland Kitchen.. 1 By Mouth Once Daily 6)  Diclofenac Sodium 75 Mg  Tbec (Diclofenac Sodium) .Marland Kitchen.. 1 By Mouth Two Times A Day After Meals 7)  Analpram-Hc Singles 1-2.5 % Crea (Hydrocortisone Ace-Pramoxine) .... Insert I Applicator Am and Pm 8)  Protonix 40 Mg Tbec (Pantoprazole Sodium) .Marland Kitchen.. 1 By Mouth Once Daily 9)  Zolpidem Tartrate 10 Mg Tabs (Zolpidem Tartrate) .... 1/2 To 1 Tab By Mouth At Bedtime As Needed Insomnia 10)  Nitrostat 0.4 Mg Subl  (Nitroglycerin) .Marland Kitchen.. 1 Tab Sl As Needed Cp, Repeat Q 5 Minutes X 3 Doses or Until Pain Relief. If No Pain Relief To Er For Evaluation 11)  Alprazolam 0.25 Mg Tabs (Alprazolam) .... 1/2 To 1 Tab By Mouth Two Times A Day As Needed Anxiety/sob/insomnia  Allergies (verified): No Known Drug Allergies  Past History:  Past Medical History: Last updated: 08/19/2007 Depression Hyperlipidemia Hypertension Hypothyroidism  Past Surgical History: Last updated: 08/19/2007 Tonsillectomy Thyroidectomy (partial) Lithotripsy  Family History: Last updated: 10-Sep-2010 Mother: Deceased stroke age 65; CAD; stents x 2 Father: Deceased Siblings: 4 sisters; good health                1 Brother;CAD stent.  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  Regular Exercise - yes Drug Use - no  Risk Factors: Smoking Status: never (September 10, 2010)  Review of Systems      See HPI  Physical Exam  General:  Well-developed,well-nourished,in no acute distress; alert,appropriate and cooperative throughout examination Head:  Normocephalic and atraumatic without obvious abnormalities. No apparent alopecia or balding. Eyes:  No corneal or conjunctival inflammation noted. EOMI. Perrla. Funduscopic exam benign, without hemorrhages, exudates or papilledema. Vision grossly normal. Ears:  evidence of external otitis in the left ear with the because of the erythematous with drainage and edema was  Nose:  External nasal examination shows no deformity or inflammation. Nasal mucosa are pink and moist without lesions or exudates. Mouth:  Oral mucosa and oropharynx without lesions or exudates.  Teeth in good repair. Neck:  tenderness over cervical spine minimal minimal pain on rotation of the neck Chest Wall:  No deformities, masses, tenderness or gynecomastia noted. Breasts:  No masses or gynecomastia noted Lungs:  Normal respiratory effort, chest expands  symmetrically. Lungs are clear to auscultation, no crackles or wheezes. Heart:  Normal rate and regular rhythm. S1 and S2 normal without gallop, murmur, click, rub or other extra sounds. Abdomen:  Bowel sounds positive,abdomen soft and non-tender without masses, organomegaly or hernias noted. Rectal:  No external abnormalities noted. Normal sphincter tone. No rectal masses or tenderness. Genitalia:  Testes bilaterally descended without nodularity, tenderness or masses. No scrotal masses or lesions. No penis lesions or urethral discharge. Prostate:  Prostate gland firm and smooth, no enlargement, nodularity, tenderness, mass, asymmetry or induration. Msk:  No deformity or scoliosis noted of thoracic or lumbar spine.   Pulses:  R and L carotid,radial,femoral,dorsalis pedis and posterior tibial pulses are full and equal bilaterally Extremities:  No clubbing, cyanosis, edema, or deformity noted with normal full range of motion of all joints.   Neurologic:  No cranial nerve deficits noted. Station and gait are normal. Plantar reflexes are down-going bilaterally. DTRs are symmetrical throughout. Sensory, motor and coordinative functions appear intact. Skin:  Intact without suspicious lesions or rashes Cervical Nodes:  No lymphadenopathy noted Axillary Nodes:  No palpable lymphadenopathy Inguinal Nodes:  No significant adenopathy Psych:  Cognition and judgment appear intact. Alert and cooperative with normal attention span and concentration. No apparent delusions, illusions, hallucinations   Impression & Recommendations:  Problem # 1:  EXTERNAL OTITIS (ICD-380.10) Assessment New  His updated medication list for this problem includes:    Cortisporin 3.5-10000-1 Soln (Neomycin-polymyxin-hc) .Marland KitchenMarland KitchenMarland KitchenMarland Kitchen 4 qtts in ear three times a day  for canal inection  Problem # 2:  PALPITATIONS (ICD-785.1) Assessment: Improved  Problem # 3:  ARTHRITIS, CERVICAL SPINE (ICD-721.90) Assessment: Improved  Problem # 4:   COSTOCHONDRITIS, LEFT (ICD-733.6) Assessment: Improved  Problem # 5:  PHYSICAL EXAMINATION (ICD-V70.0) Assessment: Comment Only  Problem # 6:  RENAL CALCULUS, HX OF (ICD-V13.01) Assessment: Improved  Problem # 7:  HYPERTENSION (ICD-401.9) Assessment: Improved  His updated medication list for this problem includes:    Diovan 80 Mg Tabs (Valsartan) .Marland Kitchen... 1 by mouth once daily  Problem # 8:  HYPOTHYROIDISM (ICD-244.9) Assessment: Improved  His updated medication list for this problem includes:    Synthroid 112 Mcg Tabs (Levothyroxine sodium) .Marland Kitchen... 1 by mouth once daily  Problem # 9:  HYPERLIPIDEMIA (ICD-272.4) Assessment: Improved  The following medications were removed from the medication list:    Zocor 40 Mg Tabs (Simvastatin) ..... Once daily His updated medication list for this problem includes:    Simvastatin 20 Mg Tabs (Simvastatin) .Marland Kitchen... 1 hs  Problem # 10:  DEPRESSION (ICD-311) Assessment: Improved  His updated medication list for this problem includes:    Alprazolam 0.25 Mg Tabs (Alprazolam) .Marland Kitchen... 1/2 to 1 tab by mouth two times a day as needed anxiety/sob/insomnia    Alprazolam 0.5 Mg Tabs (Alprazolam) .Marland Kitchen... 1 morn, midafternoon and hs  Complete Medication List: 1)  Tramadol Hcl 50 Mg Tabs (Tramadol hcl) .... Take 1-2 tabs q4-6h as needed pain 2)  Synthroid 112 Mcg Tabs (Levothyroxine sodium) .Marland Kitchen.. 1 by mouth once daily 3)  Diovan 80 Mg Tabs (  Valsartan) .Marland Kitchen.. 1 by mouth once daily 4)  Adult Aspirin Ec Low Strength 81 Mg Tbec (Aspirin) .Marland Kitchen.. 1 by mouth once daily 5)  Diclofenac Sodium 75 Mg Tbec (Diclofenac sodium) .Marland Kitchen.. 1 by mouth two times a day after meals 6)  Analpram-hc Singles 1-2.5 % Crea (Hydrocortisone ace-pramoxine) .... Insert i applicator am and pm 7)  Protonix 40 Mg Tbec (Pantoprazole sodium) .Marland Kitchen.. 1 by mouth once daily 8)  Nitrostat 0.4 Mg Subl (Nitroglycerin) .Marland Kitchen.. 1 tab sl as needed cp, repeat q 5 minutes x 3 doses or until pain relief. if no pain relief  to er for evaluation 9)  Alprazolam 0.25 Mg Tabs (Alprazolam) .... 1/2 to 1 tab by mouth two times a day as needed anxiety/sob/insomnia 10)  Simvastatin 20 Mg Tabs (Simvastatin) .Marland Kitchen.. 1 hs 11)  Alprazolam 0.5 Mg Tabs (Alprazolam) .Marland Kitchen.. 1 morn, midafternoon and hs 12)  Zolpidem Tartrate 10 Mg Tabs (Zolpidem tartrate) .Marland Kitchen.. 1 hs for insomnia 13)  Cortisporin 3.5-10000-1 Soln (Neomycin-polymyxin-hc) .... 4 qtts in ear three times a day  for canal inection  Patient Instructions: 1)  physical examination real healthy male 2)  Lab studies were good 3)  It is important that you exercise reguarly at least 20 minutes 5 times a week. If you develop chest pain, have severe difficulty breathing, or feel very tired, stop exercising immediately and seek medical attention.  4)  External otitis or infection of the canal significant for Cortisporin Otic drops 5)  Refill medication Prescriptions: CORTISPORIN 3.5-10000-1 SOLN (NEOMYCIN-POLYMYXIN-HC) 4 qtts in ear three times a day  for canal inection  #5.0 cc x 1   Entered by:   Trixie Dredge   Authorized by:   Judithann Sheen MD   Signed by:   Judithann Sheen MD on 12/04/2010   Method used:   [facility] to        [facility] [phone]* (retail)       [address] [place], [place]  [address]       Ph: [phone] or [phone]       Fax: [phone]   RxID:   [id number] ZOLPIDEM TARTRATE 10 MG TABS (ZOLPIDEM TARTRATE) 1 hs for insomnia  #90 x 1   Entered and Authorized by:   Judithann Sheen MD   Signed by:   Judithann Sheen MD on 11/21/2010   Method used:   Print then Give to Patient   RxID:   [id number] ALPRAZOLAM 0.5 MG TABS (ALPRAZOLAM) 1 morn, midafternoon and hs  #90 x 1   Entered and Authorized by:   Judithann Sheen MD   Signed by:   Judithann Sheen MD on 11/21/2010   Method used:   Print then Give to Patient   RxID:   [id number] PROTONIX 40 MG TBEC  (PANTOPRAZOLE SODIUM) 1 by mouth once daily  #90 x 3   Entered and Authorized by:   Judithann Sheen MD   Signed by:   Judithann Sheen MD on 11/21/2010   Method used:   Electronically to        [facility] [phone]* (retail)       [address] [place], [place]  [address]       Ph: [phone]  or [phone]       Fax: [phone]   RxID:   [id number] PROTONIX 40 MG TBEC (PANTOPRAZOLE SODIUM) 1 by mouth once daily  #90 x 3   Entered and Authorized by:   Judithann Sheen MD   Signed by:   Judithann Sheen MD on 11/21/2010   Method used:   Electronically to        [facility] [phone]* (retail)       [address] [place], [place]  [address]       Ph: [phone] or [phone]       Fax: [phone]   RxID:   [id number] DIOVAN 80 MG TABS (VALSARTAN) 1 by mouth once daily  #90 x 3   Entered and Authorized by:   Judithann Sheen MD   Signed by:   Judithann Sheen MD on 11/21/2010   Method used:   Electronically to        [facility] [phone]* (retail)       [address] [place], [place]  [address]       Ph: [phone] or [phone]       Fax: [phone]   RxID:   [id number] SIMVASTATIN 20 MG TABS (SIMVASTATIN) 1 hs  #90 x 3   Entered and Authorized by:   Judithann Sheen MD   Signed by:   Judithann Sheen MD on 11/21/2010   Method used:   Electronically to        [facility] [phone]* (retail)       [address] [place], [place]  [address]       Ph: [phone] or [phone]       Fax: [phone]   RxID:   [id number] SYNTHROID 112 MCG TABS (LEVOTHYROXINE SODIUM) 1 by mouth once daily  #90 x 3   Entered and Authorized by:   Judithann Sheen MD   Signed by:   Judithann Sheen MD on 11/21/2010   Method used:   Electronically to        [facility] [phone]* (retail)       [address] [place], [place]  [address]       Ph: [phone] or [phone]       Fax: [phone]   RxID:   [id number]    Orders Added: 1)  Est. Patient 40-64 years [id number]

## 2010-12-19 NOTE — Letter (Signed)
Summary: Lipid Letter  [name]  [address] [place], [place] [address]   Phone: [phone]  Fax: [phone]    11/19/2010  Micheal Jones [address] [place], [place]  [address]  Dear Micheal Jones:  We have carefully reviewed your last lipid profile from 07/16/2010 and the results are noted below with a summary of recommendations for lipid management.    Cholesterol:       188     Goal: <   HDL "good" Cholesterol:   35.36     Goal: >   LDL "bad" Cholesterol:   118     Goal: <   Triglycerides:       167.0     Goal: <        TLC Diet (Therapeutic Lifestyle Change): Saturated Fats & Transfatty acids should be kept < 7% of total calories ***Reduce Saturated Fats Polyunstaurated Fat can be up to 10% of total calories Monounsaturated Fat Fat can be up to 20% of total calories Total Fat should be no greater than 25-35% of total calories Carbohydrates should be 50-60% of total calories Protein should be approximately 15% of total calories Fiber should be at least 20-30 grams a day ***Increased fiber may help lower LDL Total Cholesterol should be < 200mg /day Consider adding plant stanol/sterols to diet (example: Benacol spread) ***A higher intake of unsaturated fat may reduce Triglycerides and Increase HDL    Adjunctive Measures (may lower LIPIDS and reduce risk of Heart Attack) include: Aerobic Exercise (20-30 minutes 3-4 times a week) Limit Alcohol Consumption Weight Reduction Aspirin 75-81 mg a day by mouth (if not allergic or contraindicated) Dietary Fiber 20-30 grams a day by mouth     Current Medications: 1)    Tramadol Hcl 50 Mg  Tabs (Tramadol hcl) .... Take 1-2 tabs q4-6h as needed pain 2)    Synthroid 112 Mcg Tabs (Levothyroxine sodium) .Marland Kitchen.. 1 by mouth once daily 3)    Zocor 40 Mg Tabs (Simvastatin) .... Once daily 4)    Diovan 80 Mg Tabs (Valsartan) .Marland Kitchen.. 1 by mouth once daily 5)    Adult Aspirin Ec Low Strength 81 Mg  Tbec (Aspirin) .Marland Kitchen.. 1 by  mouth once daily 6)    Diclofenac Sodium 75 Mg  Tbec (Diclofenac sodium) .Marland Kitchen.. 1 by mouth two times a day after meals 7)    Analpram-hc Singles 1-2.5 % Crea (Hydrocortisone ace-pramoxine) .... Insert i applicator am and pm 8)    Protonix 40 Mg Tbec (Pantoprazole sodium) .Marland Kitchen.. 1 by mouth once daily 9)    Zolpidem Tartrate 10 Mg Tabs (Zolpidem tartrate) .... 1/2 to 1 tab by mouth at bedtime as needed insomnia 10)    Nitrostat 0.4 Mg Subl (Nitroglycerin) .Marland Kitchen.. 1 tab sl as needed cp, repeat q 5 minutes x 3 doses or until pain relief. if no pain relief to er for evaluation 11)    Alprazolam 0.25 Mg Tabs (Alprazolam) .... 1/2 to 1 tab by mouth two times a day as needed anxiety/sob/insomnia  If you have any questions, please call. We appreciate being able to work with you.   Sincerely,    Micheal Jones at [facility] MD  Appended Document: Lipid Letter unable to reach pt via phone. mess left. letter mailed with copy of labs.

## 2010-12-31 ENCOUNTER — Telehealth: Payer: Self-pay | Admitting: Family Medicine

## 2010-12-31 MED ORDER — VALSARTAN 80 MG PO TABS: 80.0000 mg | ORAL_TABLET | Freq: Every day | ORAL | Status: DC

## 2010-12-31 NOTE — Telephone Encounter (Signed)
Refill Diovan 80mg  1qd. [facility] in [place].

## 2011-02-06 ENCOUNTER — Other Ambulatory Visit: Payer: Self-pay | Admitting: Family Medicine

## 2011-02-07 ENCOUNTER — Other Ambulatory Visit: Payer: Self-pay | Admitting: Family Medicine

## 2011-02-07 MED ORDER — HYDROCORTISONE ACE-PRAMOXINE 2.5-1 % RE CREA: TOPICAL_CREAM | Freq: Three times a day (TID) | RECTAL | Status: AC

## 2011-02-07 MED ORDER — ALPRAZOLAM 0.5 MG PO TABS: 0.5000 mg | ORAL_TABLET | Freq: Every evening | ORAL | Status: AC | PRN

## 2011-02-07 MED ORDER — VALSARTAN 80 MG PO TABS: 80.0000 mg | ORAL_TABLET | Freq: Every day | ORAL | Status: DC

## 2011-02-07 MED ORDER — NITROGLYCERIN 0.4 MG SL SUBL: 0.4000 mg | SUBLINGUAL_TABLET | SUBLINGUAL | Status: DC | PRN

## 2011-02-07 MED ORDER — ZOLPIDEM TARTRATE 10 MG PO TABS: 10.0000 mg | ORAL_TABLET | Freq: Every evening | ORAL | Status: DC | PRN

## 2011-02-07 MED ORDER — TRAMADOL HCL 50 MG PO TABS: 50.0000 mg | ORAL_TABLET | Freq: Four times a day (QID) | ORAL | Status: AC | PRN

## 2011-02-07 MED ORDER — NEOMYCIN-POLYMYXIN-HC 3.5-10000-1 OT SOLN: 3.0000 [drp] | Freq: Four times a day (QID) | OTIC | Status: AC

## 2011-02-07 MED ORDER — DICLOFENAC SODIUM 75 MG PO TBEC: 75.0000 mg | DELAYED_RELEASE_TABLET | Freq: Two times a day (BID) | ORAL | Status: DC

## 2011-02-07 NOTE — Telephone Encounter (Signed)
Pt called and stated that it is an inconvience for him to pick up medications every month would rather they be called in for a 1 yr if possible---rx called in to pharmacy---pt aware

## 2011-02-12 ENCOUNTER — Encounter: Payer: Self-pay | Admitting: Family Medicine

## 2011-04-01 NOTE — Procedures (Signed)
NAMELAMARKUS, NEBEL NO.:  [id number]   MEDICAL RECORD NO.:  [id number]          PATIENT TYPE:  OUT   LOCATION:  SLEEP CENTER                 FACILITY:  [facility]   PHYSICIAN:  Barbaraann Share, MD,FCCPDATE OF BIRTH:  1956/05/10   DATE OF STUDY:  06/21/2007                            NOCTURNAL POLYSOMNOGRAM   REFERRING PHYSICIAN:   INDICATION FOR STUDY:  Hypersomnia with sleep apnea.   EPWORTH SLEEPINESS SCORE:  9   MEDICATIONS:   SLEEP ARCHITECTURE:  The patient had a total sleep time of 350 minutes  with decreased slow wave sleep as well as REM.  Sleep onset latency was  normal and REM onset was very prolonged at 308 minutes.  Sleep  efficiency is mildly decreased at 92%.   RESPIRATORY DATA:  The patient was found to have 196 obstructive  hypopneas and 102 obstructive apneas for an apnea/hypopnea index of 51  events per hour.  The events were not positional and there was moderate  snoring noted throughout.   OXYGEN DATA:  There was O2 desaturation as low as 85% with the patient's  obstructive events.   CARDIAC DATA:  There was no arrhythmia noted throughout the night.   MOVEMENT-PARASOMNIA:  The patient was found to have small numbers of leg  jerks with no significant sleep disruption.   IMPRESSIONS-RECOMMENDATIONS:  Severe obstructive sleep apnea/hypopnea  syndrome with an apnea/hypopnea index of 51 events per hour and oxygen  desaturation as low as 85%.  Treatment for this degree of sleep apnea  should focus primarily on weight loss as well as CPAP.      Barbaraann Share, MD,FCCP  Diplomate, American Board of Sleep  Medicine  Electronically Signed    [facility]/MED[facility]  D:  06/30/2007 16:49:37  T:  07/01/2007 21:49:08  Job:  [id number]

## 2011-04-01 NOTE — Assessment & Plan Note (Signed)
[facility]                             PULMONARY OFFICE NOTE   NAME:Micheal Jones, Micheal Jones                       MRN:          [id number]  DATE:08/11/2007                            DOB:          01-09-1956    HISTORY OF PRESENT ILLNESS:  The patient is a 55 year old male who I  have been asked to see for obstructive sleep apnea.  The patient has  recently undergone nocturnal polysomnography on June 21, 2007, where he  had an apnea/hypopnea index of 51 events per hour and O2 desaturation as  low as 85%.  The patient states that he does have loud snoring and he  has been told that he has pauses in his breathing during sleep.  He  denies any choking arousals.  He typically gets to bed between 10 and 11  and starts his day at 5:30 in the morning.  He is not rested upon  arising.  The patient works as an Passenger transport manager and has  significant sleep pressure during the day with periods of inactivity.  He definitely has decreased concentration and alertness.  He will  typically get his second wind in the evening and therefore has no  trouble with movies or TV.  He denies any problems with driving.   PAST MEDICAL HISTORY:  1. Significant for hypertension.  2. History of allergic rhinitis.  3. History of thyroid surgery approximately 20 years for which he is      on replacement and apparently his values have been normal.   CURRENT MEDICATIONS:  1. Diovan of unknown dose daily.  2. Synthroid of unknown dose daily.  3. Zocor of unknown dose daily.  4. Lexapro of unknown dose daily.   ALLERGIES:  No known drug allergies.   SOCIAL HISTORY:  He is married.  He has a remote history of smoking and  has not done so since 1978.   FAMILY HISTORY:  Noncontributory in first degree relatives.   REVIEW OF SYSTEMS:  As per history of illness.  See patient intake form  documented in the chart.   PHYSICAL EXAMINATION:  GENERAL:  He is an overweight male in no acute  distress.  VITAL SIGNS:  Blood pressure 120/72, pulse 97, temperature 98.5, weight  189 pounds.  He is 5 feet 8 inches tall.  O2 saturation on room air is  95%.  HEENT:  Pupils equal, round, and reactive to light and accommodation.  Extraocular muscles are intact. Nares are patent with deviated septum to  the right.  Oropharynx does show elongation of soft palate and uvula.  NECK:  Supple without JVD or lymphadenopathy.  No palpable thyromegaly.  CHEST:  Totally clear.  HEART:  Regular rate and rhythm with no murmurs, rubs, or gallops.  ABDOMEN:  Soft and nontender with good bowel sounds.  GENITOURINARY:  RECTAL:  BREASTS:  Not done and not indicated.  EXTREMITIES:  Lower extremities are without edema.  Pulses are intact  distally.  NEUROLOGY:  He is alert and oriented with no evidence of motor deficits.   IMPRESSION:  Severe obstructive sleep  apnea documented by nocturnal  polysomnography.  The patient is clearly symptomatic during the day and  has increased risk of cardiovascular events over a period of time with  this degree of sleep apnea.  I have had a long discussion with him and  he agrees to treatment.  I really feel like CPAP and weight loss are in  his best interest.  He is agreeable to this.   PLAN:  1. Work on weight loss.  2. CPAP at 10 cm with heated humidity.  3. The patient will follow up in four weeks or sooner if there are      problems.     Barbaraann Share, MD,FCCP  Electronically Signed    [facility]/MedQ  DD: 08/12/2007  DT: 08/12/2007  Job #: [id number]   cc:   Ellin Saba., MD

## 2011-05-22 ENCOUNTER — Ambulatory Visit (INDEPENDENT_AMBULATORY_CARE_PROVIDER_SITE_OTHER): Payer: BC Managed Care – PPO | Admitting: Family Medicine

## 2011-05-22 ENCOUNTER — Encounter: Payer: Self-pay | Admitting: Family Medicine

## 2011-05-22 DIAGNOSIS — J029 Acute pharyngitis, unspecified: Secondary | ICD-10-CM

## 2011-05-22 DIAGNOSIS — R498 Other voice and resonance disorders: Secondary | ICD-10-CM

## 2011-05-22 DIAGNOSIS — R499 Unspecified voice and resonance disorder: Secondary | ICD-10-CM

## 2011-05-22 NOTE — Patient Instructions (Signed)
Planning an appt with ENT physician to determine cause of hoarseness or change of  Voice Continue other medicines, I can not feel nor see any [place] will call time of appointment

## 2011-06-17 ENCOUNTER — Encounter: Payer: Self-pay | Admitting: Family Medicine

## 2011-06-17 NOTE — Progress Notes (Signed)
  Subjective:    Patient ID: Micheal Jones, male    DOB: 11-04-1956, 55 y.o.   MRN: [id number] This 55 year old white divorced male is in today complaining that he has had a throat eructation over the past 2 weeks . He has the sensation that something is in his throat as noted some change in his voice he is now hoarse. Throat is not the typical sore throat he also relates he has decreased hearing in the left ear HPI    Review of Systems review of systems is entirely negative other than as in the history of present illness should mention patient has had GERD in the past and could possibly be contributing to this problem     Objective:   Physical Exam patient is a well-built well-nourished white male who does not appear to be in any distress HEENT examination reveals ears to be normal and tympanic membranes clear. Examination of the oral pharynx reveals erythema or irritation of  posterior pharynx. Nasal examination not remarkable No lymphadenopathy in anterior cervical region Lungs clear to palpation percussion auscultation Heart no abnormalities noted Abdomen negative liver spleen kidneys are nonpalpable no tenderness in the epigastric rate          Assessment & Plan:  Pharyngitis hoarseness no evidence of infection patient does have history of GERD which could possibly cause this But since she has a sensation of something in his throat which I cannot see will refer the patient to an Otolaryngologist Instructed to continue his treatment for hypertension and hypothyroidism

## 2011-08-12 ENCOUNTER — Other Ambulatory Visit: Payer: Self-pay

## 2011-08-12 MED ORDER — ZOLPIDEM TARTRATE 10 MG PO TABS: 10.0000 mg | ORAL_TABLET | Freq: Every evening | ORAL | Status: DC | PRN

## 2011-08-12 MED ORDER — ALPRAZOLAM 0.5 MG PO TABS: ORAL_TABLET | ORAL | Status: DC

## 2011-08-12 NOTE — Telephone Encounter (Signed)
Ok per Dr. Scotty Court to call in alprazolam and [place].

## 2011-12-27 ENCOUNTER — Other Ambulatory Visit: Payer: Self-pay | Admitting: Family Medicine

## 2011-12-31 ENCOUNTER — Telehealth: Payer: Self-pay | Admitting: Family Medicine

## 2011-12-31 NOTE — Telephone Encounter (Signed)
Okay to refill until then

## 2011-12-31 NOTE — Telephone Encounter (Signed)
Pt needs refill simvastatin 20mg  call into [facility] [address] Pt has appt with new pcp (Dr Para March )02-03-2012

## 2011-12-31 NOTE — Telephone Encounter (Signed)
Rx sent to pharmacy   

## 2012-01-22 ENCOUNTER — Other Ambulatory Visit: Payer: Self-pay | Admitting: Family Medicine

## 2012-01-22 NOTE — Telephone Encounter (Signed)
Pt has an appt with Dr. Para March on 02/03/11.  Pls advise.

## 2012-01-22 NOTE — Telephone Encounter (Signed)
One month refill only.

## 2012-02-03 ENCOUNTER — Encounter: Payer: Self-pay | Admitting: Family Medicine

## 2012-02-03 ENCOUNTER — Ambulatory Visit (INDEPENDENT_AMBULATORY_CARE_PROVIDER_SITE_OTHER): Payer: BC Managed Care – PPO | Admitting: Family Medicine

## 2012-02-03 VITALS — BP 124/80 | HR 64 | Temp 98.6°F | Ht 67.5 in | Wt 187.8 lb

## 2012-02-03 DIAGNOSIS — E7800 Pure hypercholesterolemia, unspecified: Secondary | ICD-10-CM

## 2012-02-03 DIAGNOSIS — M479 Spondylosis, unspecified: Secondary | ICD-10-CM

## 2012-02-03 DIAGNOSIS — Z23 Encounter for immunization: Secondary | ICD-10-CM

## 2012-02-03 DIAGNOSIS — F341 Dysthymic disorder: Secondary | ICD-10-CM

## 2012-02-03 DIAGNOSIS — F419 Anxiety disorder, unspecified: Secondary | ICD-10-CM

## 2012-02-03 DIAGNOSIS — E039 Hypothyroidism, unspecified: Secondary | ICD-10-CM

## 2012-02-03 DIAGNOSIS — I10 Essential (primary) hypertension: Secondary | ICD-10-CM

## 2012-02-03 DIAGNOSIS — Z0000 Encounter for general adult medical examination without abnormal findings: Secondary | ICD-10-CM

## 2012-02-03 DIAGNOSIS — E785 Hyperlipidemia, unspecified: Secondary | ICD-10-CM

## 2012-02-03 DIAGNOSIS — G4700 Insomnia, unspecified: Secondary | ICD-10-CM

## 2012-02-03 MED ORDER — VALSARTAN 80 MG PO TABS: 80.0000 mg | ORAL_TABLET | Freq: Every day | ORAL | Status: DC

## 2012-02-03 MED ORDER — NITROGLYCERIN 0.4 MG SL SUBL: 0.4000 mg | SUBLINGUAL_TABLET | SUBLINGUAL | Status: DC | PRN

## 2012-02-03 MED ORDER — ZOLPIDEM TARTRATE 10 MG PO TABS: 10.0000 mg | ORAL_TABLET | Freq: Every evening | ORAL | Status: DC | PRN

## 2012-02-03 MED ORDER — ALPRAZOLAM 0.5 MG PO TABS: ORAL_TABLET | ORAL | Status: DC

## 2012-02-03 MED ORDER — CITALOPRAM HYDROBROMIDE 20 MG PO TABS: 20.0000 mg | ORAL_TABLET | Freq: Every day | ORAL | Status: DC

## 2012-02-03 MED ORDER — DICLOFENAC SODIUM 75 MG PO TBEC: 75.0000 mg | DELAYED_RELEASE_TABLET | Freq: Two times a day (BID) | ORAL | Status: AC

## 2012-02-03 MED ORDER — LEVOTHYROXINE SODIUM 112 MCG PO TABS: 112.0000 ug | ORAL_TABLET | Freq: Every day | ORAL | Status: DC

## 2012-02-03 MED ORDER — SIMVASTATIN 20 MG PO TABS: 20.0000 mg | ORAL_TABLET | Freq: Every day | ORAL | Status: DC

## 2012-02-03 NOTE — Progress Notes (Signed)
CPE:  See prev med.    Hypothyroid: compliant with meds.  Due for labs.  H/o hemithyroidectomy.    Insomnia.  Never on other meds (other than [place]), [place] helps.  No ADE.    Anxiety- prev on lexapro and citalopram during mother's death.  High stress with his job.  Taking xanax with good effect.  H/o panic sx, with dyspnea.  Prev with neg ETT per cards.  We discussed rationale for not being only on BZD, ie adding on SSRI.    GERD: h/o ENT neg eval for voice change.  Voice change resolved.    Elevated Cholesterol: Using medications without problems:yes Muscle aches: no Diet compliance: partial, discussed, packing his lunch Exercise: none  Hypertension:    Using medication without problems or lightheadedness: yes Chest pain with exertion:no Edema:no Short of breath:no Average home BPs:not checked  Meds, vitals, and allergies reviewed.   PMH and SH reviewed  ROS: See HPI.  Otherwise negative.    GEN: nad, alert and oriented HEENT: mucous membranes moist, chronic changes to L TM noted.  NECK: supple w/o LA CV: rrr. PULM: ctab, no inc wob ABD: soft, +bs EXT: no edema SKIN: no acute rash Affect wnl.

## 2012-02-03 NOTE — Patient Instructions (Signed)
Glad to see you. Start back on the citalopram and see if you notice an improvement.  Incorporate more exercise into your schedule. Call with concerns.  Come back for labs- fasting.  I would get a flu shot each fall.

## 2012-02-05 ENCOUNTER — Encounter: Payer: Self-pay | Admitting: Family Medicine

## 2012-02-05 DIAGNOSIS — Z0000 Encounter for general adult medical examination without abnormal findings: Secondary | ICD-10-CM | POA: Insufficient documentation

## 2012-02-05 NOTE — Assessment & Plan Note (Signed)
See notes on labs when resulted.  Work on [employer], exercise. Continue meds.

## 2012-02-05 NOTE — Assessment & Plan Note (Signed)
Start back on SSRI, call back as needed, continue other meds. Okay for outpatient f/u.

## 2012-02-05 NOTE — Assessment & Plan Note (Signed)
See notes on labs when resulted.  Work on weight, exercise. Continue meds. 

## 2012-02-05 NOTE — Assessment & Plan Note (Signed)
Continue voltaren with GI caution.

## 2012-02-05 NOTE — Assessment & Plan Note (Signed)
Continue ambien.  No ADE.

## 2012-02-05 NOTE — Assessment & Plan Note (Signed)
Continue meds, see notes on TSH when resulted.

## 2012-02-05 NOTE — Assessment & Plan Note (Signed)
Colon cancer screening up to date.  Tdap today.  Flu shot encouraged.  Healthy habits discussed. PSA options were discussed along with recent recs.  No indication for psa at this point, since patient is low risk and there is no FH of prosate CA.  He declined testing of PSA.

## 2012-02-06 ENCOUNTER — Other Ambulatory Visit: Payer: Self-pay | Admitting: *Deleted

## 2012-02-06 ENCOUNTER — Other Ambulatory Visit (INDEPENDENT_AMBULATORY_CARE_PROVIDER_SITE_OTHER): Payer: BC Managed Care – PPO

## 2012-02-06 DIAGNOSIS — I10 Essential (primary) hypertension: Secondary | ICD-10-CM

## 2012-02-06 DIAGNOSIS — E7800 Pure hypercholesterolemia, unspecified: Secondary | ICD-10-CM

## 2012-02-06 LAB — COMPREHENSIVE METABOLIC PANEL
Alkaline Phosphatase: 62 U/L (ref 39–117)
Creatinine, Ser: 1.1 mg/dL (ref 0.4–1.5)
Glucose, Bld: 89 mg/dL (ref 70–99)
Sodium: 136 mEq/L (ref 135–145)
Total Bilirubin: 1.4 mg/dL — ABNORMAL HIGH (ref 0.3–1.2)
Total Protein: 7.2 g/dL (ref 6.0–8.3)

## 2012-02-06 LAB — LIPID PANEL
HDL: 43.8 mg/dL (ref >39.00)
VLDL: 24.8 mg/dL (ref 0.0–40.0)

## 2012-02-06 LAB — TSH: TSH: 0.98 u[IU]/mL (ref 0.35–5.50)

## 2012-02-06 NOTE — Telephone Encounter (Signed)
[facility] [address] [facility] [place].  Medication is not on medication list, added for refill request.    Please advise.

## 2012-02-06 NOTE — Telephone Encounter (Signed)
Opened in error

## 2012-02-06 NOTE — Telephone Encounter (Signed)
CORRECTION:  Medication is on meds list.

## 2012-02-08 NOTE — Telephone Encounter (Signed)
Already printed and given to patient 02/03/12.  Notify pharmacy.

## 2012-02-09 ENCOUNTER — Encounter: Payer: Self-pay | Admitting: *Deleted

## 2012-02-09 NOTE — Telephone Encounter (Signed)
Faxed info back to pharmacy.

## 2012-02-18 ENCOUNTER — Other Ambulatory Visit: Payer: Self-pay | Admitting: Family Medicine

## 2012-02-19 NOTE — Telephone Encounter (Signed)
Sent!

## 2012-02-25 ENCOUNTER — Other Ambulatory Visit: Payer: Self-pay | Admitting: Family Medicine

## 2012-03-24 ENCOUNTER — Telehealth: Payer: Self-pay | Admitting: Family Medicine

## 2012-03-24 MED ORDER — ZOLPIDEM TARTRATE 10 MG PO TABS: 10.0000 mg | ORAL_TABLET | Freq: Every evening | ORAL | Status: DC | PRN

## 2012-03-24 NOTE — Telephone Encounter (Signed)
Rx called in as directed.   

## 2012-03-24 NOTE — Telephone Encounter (Signed)
Ok to refill 

## 2012-03-24 NOTE — Telephone Encounter (Signed)
Requesting ambien refill. Please call patient.

## 2012-03-24 NOTE — Telephone Encounter (Signed)
Please call in.  Thanks.   

## 2012-03-26 ENCOUNTER — Other Ambulatory Visit: Payer: Self-pay | Admitting: Family Medicine

## 2012-03-28 NOTE — Telephone Encounter (Signed)
Sent!

## 2012-08-05 ENCOUNTER — Other Ambulatory Visit: Payer: Self-pay | Admitting: *Deleted

## 2012-08-05 NOTE — Telephone Encounter (Signed)
Faxed refill request.  Last filled 07/04/12

## 2012-08-06 ENCOUNTER — Other Ambulatory Visit: Payer: Self-pay | Admitting: *Deleted

## 2012-08-06 MED ORDER — ALPRAZOLAM 0.5 MG PO TABS: ORAL_TABLET | ORAL | Status: DC

## 2012-08-06 NOTE — Telephone Encounter (Signed)
Medication phoned to pharmacy.  

## 2012-08-06 NOTE — Telephone Encounter (Signed)
Please call in

## 2012-08-18 ENCOUNTER — Ambulatory Visit: Payer: BC Managed Care – PPO | Admitting: Family Medicine

## 2012-08-20 ENCOUNTER — Encounter: Payer: Self-pay | Admitting: Family Medicine

## 2012-08-20 ENCOUNTER — Ambulatory Visit (INDEPENDENT_AMBULATORY_CARE_PROVIDER_SITE_OTHER): Payer: BC Managed Care – PPO | Admitting: Family Medicine

## 2012-08-20 VITALS — BP 114/80 | HR 72 | Temp 98.6°F | Wt 183.8 lb

## 2012-08-20 DIAGNOSIS — R202 Paresthesia of skin: Secondary | ICD-10-CM | POA: Insufficient documentation

## 2012-08-20 DIAGNOSIS — E785 Hyperlipidemia, unspecified: Secondary | ICD-10-CM

## 2012-08-20 DIAGNOSIS — R0789 Other chest pain: Secondary | ICD-10-CM

## 2012-08-20 DIAGNOSIS — R1012 Left upper quadrant pain: Secondary | ICD-10-CM | POA: Insufficient documentation

## 2012-08-20 DIAGNOSIS — R209 Unspecified disturbances of skin sensation: Secondary | ICD-10-CM

## 2012-08-20 MED ORDER — SIMVASTATIN 20 MG PO TABS: 20.0000 mg | ORAL_TABLET | Freq: Every day | ORAL | Status: DC

## 2012-08-20 NOTE — Patient Instructions (Addendum)
Stop the simvastatin for 2 weeks, take only 2 diclofenac a day, and call back with an update in about 2 weeks.   Take care.

## 2012-08-20 NOTE — Assessment & Plan Note (Signed)
With diffuse muscle soreness.  Hold statin for now and he'll notify me in about 2 weeks.  He agrees.

## 2012-08-20 NOTE — Progress Notes (Signed)
Sharp pain under L side of ribs, anteriorly.  Occasional.  Going on for about 1.5 weeks. No known trauma or trigger.  Lasts for a few seconds.  No positional triggers. Can happen at rest.  He can exert himself w/o symptoms.  Not SOB.  Occ pain with a deep breath, but that doesn't tend to trigger it.  No FCNAVD. No heartburn.  No med changes. It isn't getting better or worse. No h/o CAD.   Arms and hands will tingle intermittently, equal bilaterally.  This isn't a new symptom- he'd prev woke up a few times with arm tingling (back when he was still seeing prev MD- was attributed to neck arthritis).  Now he has sx during the day occ, will self resolve. Known degenerative changes in C spine. No weakness.  Taking 3 diclofenac a day currently.   He is diffusely sore and aching.  On statin. Going on for about 1.5 weeks.   Meds, vitals, and allergies reviewed.   ROS: See HPI.  Otherwise, noncontributory.  nad ncat Neck supple but with some slight dec in ROM due to sore paraspinal muscles in neck Neck not stiff No LA rrr ctab abd soft CN 2-12 wnl B, S/S/DTR wnl x4 Chest wall not ttp but slightly ttp in the LUQ just inferior to ribs in midclavicular line w/o rash or bruising.  abd not ttp o/w, no rebound, normal BS  EKG reviewed and d/w pt.

## 2012-08-20 NOTE — Assessment & Plan Note (Signed)
Likely worsening of C spine DDD, but w/o emergent sx, ie weakness.  Monitor as he cuts back on NSAIDS.  We may need to image if continued.  Follow clinically.

## 2012-08-20 NOTE — Assessment & Plan Note (Signed)
Ribs aren't ttp, ctab, and unlikely to be cardiac.  Could be GERD from inc nsaid use.  Cut diclofenac back to 1 bid.  He'll monitor and update me.  He agrees.

## 2012-09-15 ENCOUNTER — Telehealth: Payer: Self-pay

## 2012-09-15 MED ORDER — SIMVASTATIN 20 MG PO TABS: 20.0000 mg | ORAL_TABLET | Freq: Every day | ORAL | Status: DC

## 2012-09-15 NOTE — Addendum Note (Signed)
Addended by: Joaquim Nam on: 09/15/2012 12:40 PM   Modules accepted: Orders

## 2012-09-15 NOTE — Telephone Encounter (Signed)
I would start back on the simvastatin.  If the general aches get worse, we'll know that is the cause and we'll need to change his cholesterol medicine.  Have him update [place] as needed.  I would give the rib pain a little more time.  If it continues to hurt into next week, then we'll need to recheck him and consider imaging.  Thanks.

## 2012-09-15 NOTE — Telephone Encounter (Signed)
Pt said pain in lt lower rib area is no better;the general soreness in joints is somewhat better; does not bother pt anymore. Pt is concerned about being off  cholesterol med and still have pain in lt rib.Please advise. [facility].

## 2012-09-15 NOTE — Telephone Encounter (Signed)
Also have patient add on OTC zantac 150mg  a day, see if that helps the abd/rib pain. See message below.  Thanks.

## 2012-09-16 NOTE — Telephone Encounter (Signed)
Patient advised.

## 2012-09-20 ENCOUNTER — Other Ambulatory Visit: Payer: Self-pay | Admitting: Family Medicine

## 2012-09-27 ENCOUNTER — Other Ambulatory Visit: Payer: Self-pay

## 2012-09-27 MED ORDER — ZOLPIDEM TARTRATE 10 MG PO TABS: 10.0000 mg | ORAL_TABLET | Freq: Every evening | ORAL | Status: DC | PRN

## 2012-09-27 NOTE — Telephone Encounter (Signed)
[facility] faxed refill [email or web address].last filled 08/25/12.Please advise.

## 2012-09-27 NOTE — Telephone Encounter (Signed)
Please call in

## 2012-09-28 NOTE — Telephone Encounter (Signed)
Medication phoned to pharmacy.  

## 2012-11-15 ENCOUNTER — Telehealth: Payer: Self-pay | Admitting: Family Medicine

## 2012-11-15 NOTE — Telephone Encounter (Signed)
Pt left vm stating that he started OTC Zantac (per telephone note on 09/15/12) and that his lower rib pain has not improved.  He wants to know if he needs a f/u appt to address this.  Please advise.

## 2012-11-15 NOTE — Telephone Encounter (Signed)
We need to recheck him.  Thanks.

## 2012-11-15 NOTE — Telephone Encounter (Signed)
LMOVM to call in for appt.

## 2012-11-23 ENCOUNTER — Ambulatory Visit: Payer: BC Managed Care – PPO | Admitting: Family Medicine

## 2012-11-26 ENCOUNTER — Ambulatory Visit (INDEPENDENT_AMBULATORY_CARE_PROVIDER_SITE_OTHER): Payer: BC Managed Care – PPO | Admitting: Family Medicine

## 2012-11-26 ENCOUNTER — Encounter: Payer: Self-pay | Admitting: Family Medicine

## 2012-11-26 VITALS — BP 122/76 | HR 82 | Temp 98.4°F | Wt 187.0 lb

## 2012-11-26 DIAGNOSIS — R1012 Left upper quadrant pain: Secondary | ICD-10-CM

## 2012-11-26 DIAGNOSIS — R109 Unspecified abdominal pain: Secondary | ICD-10-CM

## 2012-11-26 LAB — COMPREHENSIVE METABOLIC PANEL
ALT: 52 U/L (ref 0–53)
CO2: 31 mEq/L (ref 19–32)
Calcium: 9.5 mg/dL (ref 8.4–10.5)
Chloride: 100 mEq/L (ref 96–112)
GFR: 72.96 mL/min (ref >60.00)
Potassium: 4.6 mEq/L (ref 3.5–5.1)
Sodium: 138 mEq/L (ref 135–145)
Total Protein: 7.6 g/dL (ref 6.0–8.3)

## 2012-11-26 LAB — CBC WITH DIFFERENTIAL/PLATELET
Basophils Absolute: 0 10*3/uL (ref 0.0–0.1)
Hemoglobin: 17 g/dL (ref 13.0–17.0)
Lymphocytes Relative: 34.1 % (ref 12.0–46.0)
Monocytes Relative: 5.9 % (ref 3.0–12.0)
Neutrophils Relative %: 56.2 % (ref 43.0–77.0)
Platelets: 238 10*3/uL (ref 150.0–400.0)
RDW: 13.3 % (ref 11.5–14.6)

## 2012-11-26 NOTE — Patient Instructions (Addendum)
Go to the lab on the way out.  We'll contact you with your lab report. See Shirlee Limerick about your referral before you leave today. We'll be in touch about the ultrasound.  Don't change your meds for now.

## 2012-11-26 NOTE — Progress Notes (Signed)
Cutting out the simvastatin helped with diffuse aches.  After starting back on simvastatin he didn't have the aches return.  He didn't cut the dose.   Still with LUQ pain, not helped with zantac.  Pain is constant.  He cut back on the diclofenac to 1 bid.  Pain can wax and wane w/o clear trigger. Sometimes with movement, he'll get pain.  It sometimes worsens w/o movement.  He describes it as sharp.  He feels like it is deep to the ribs.  No vomiting, heartburn or diarrhea. No blood in stool.  No fevers.  No rash.  Occ now it will radiate to the RUQ, but the LUQ pain is worse then the RUQ pain. No recent weight loss. No diet changes.  Some nocturia but no dysuria.  Normal colonoscopy 2009.  Meds, vitals, and allergies reviewed.   ROS: See HPI.  Otherwise, noncontributory.  nad ncat Mmm rrr ctab abd soft, not ttp except mildly in the LUQ at the midclavicular line, no rebound. Normal BS No cva pain.  Ext w/o edema Skin w/o rash or jaundice.

## 2012-11-28 NOTE — Assessment & Plan Note (Signed)
Unclear source and I d/w pt about this. Would check u/s first, labs are unremarkable.  Doesn't appear to be originating in the abd wall. Nontoxic, okay for outpatient f/u.  May need to have pt see GI if not improving and u/s unremarkable.  Pt agrees with plan.

## 2012-12-02 ENCOUNTER — Encounter: Payer: Self-pay | Admitting: Family Medicine

## 2012-12-02 ENCOUNTER — Encounter: Payer: Self-pay | Admitting: Gastroenterology

## 2012-12-02 ENCOUNTER — Other Ambulatory Visit: Payer: Self-pay | Admitting: Family Medicine

## 2012-12-02 ENCOUNTER — Ambulatory Visit: Payer: Self-pay | Admitting: Family Medicine

## 2012-12-02 ENCOUNTER — Ambulatory Visit: Payer: BC Managed Care – PPO | Admitting: Family Medicine

## 2012-12-02 DIAGNOSIS — R1012 Left upper quadrant pain: Secondary | ICD-10-CM

## 2012-12-29 ENCOUNTER — Ambulatory Visit: Payer: BC Managed Care – PPO | Admitting: Gastroenterology

## 2012-12-31 ENCOUNTER — Other Ambulatory Visit: Payer: Self-pay | Admitting: Family Medicine

## 2013-01-12 ENCOUNTER — Encounter: Payer: Self-pay | Admitting: Gastroenterology

## 2013-01-12 ENCOUNTER — Ambulatory Visit (INDEPENDENT_AMBULATORY_CARE_PROVIDER_SITE_OTHER): Payer: BC Managed Care – PPO | Admitting: Gastroenterology

## 2013-01-12 VITALS — BP 110/50 | HR 88 | Ht 67.5 in | Wt 189.2 lb

## 2013-01-12 DIAGNOSIS — K760 Fatty (change of) liver, not elsewhere classified: Secondary | ICD-10-CM

## 2013-01-12 DIAGNOSIS — K7689 Other specified diseases of liver: Secondary | ICD-10-CM

## 2013-01-12 NOTE — Progress Notes (Signed)
History of Present Illness: This is a 57 year old male with a several month history of recurrent left upper quadrant and left costal margin pain. He takes Voltaren chronically for neck pain. Tenderness at the left costal margin has been noted by his PCP. A recent abdominal ultrasound showed only hepatic steatosis. He previously underwent colonoscopy in 2009 which was normal. His pain changes with movement and positioning. It is not related to any digestive function. Denies weight loss, constipation, diarrhea, change in stool caliber, melena, hematochezia, nausea, vomiting, dysphagia, reflux symptoms, chest pain.  Review of Systems: Pertinent positive and negative review of systems were noted in the above HPI section. All other review of systems were otherwise negative.  Current Medications, Allergies, Past Medical History, Past Surgical History, Family History and Social History were reviewed in [employer] record.  Physical Exam: General: Well developed , well nourished, no acute distress Head: Normocephalic and atraumatic Eyes:  sclerae anicteric, EOMI Ears: Normal auditory acuity Mouth: No deformity or lesions Neck: Supple, no masses or thyromegaly Lungs: Clear throughout to auscultation, left costal margin and left lower ribs are moderately tender Heart: Regular rate and rhythm; no murmurs, rubs or bruits Abdomen: Soft, mild left upper quadrant tenderness without rebound or guarding and non distended. No masses, hepatosplenomegaly or hernias noted. Normal Bowel sounds Musculoskeletal: Symmetrical with no gross deformities  Skin: No lesions on visible extremities Pulses:  Normal pulses noted Extremities: No clubbing, cyanosis, edema or deformities noted Neurological: Alert oriented x 4, grossly nonfocal Cervical Nodes:  No significant cervical adenopathy Inguinal Nodes: No significant inguinal adenopathy Psychological:  Alert and cooperative. Normal mood and  affect  Assessment and Recommendations:  1. Left lower rib and left upper quadrant pain. I suspect this is musculoskeletal. Need to exclude ulcer disease given chronic NSAID usage. Rule out other gastrointestinal and intra-abdominal causes of pain with an abdominal/pelvic CT scan.  2. Colorectal cancer screening, average risk. Colonoscopy September 2019.

## 2013-01-12 NOTE — Patient Instructions (Addendum)
  You have been scheduled for a CT scan of the abdomen and pelvis at [facility] ([address]---this is in the same building as [facility]).   You are scheduled on 01/19/13 at 9:00am. You should arrive 15 minutes prior to your appointment time for registration. Please follow the written instructions below on the day of your exam:  WARNING: IF YOU ARE ALLERGIC TO IODINE/X-RAY DYE, PLEASE NOTIFY RADIOLOGY IMMEDIATELY AT [phone]! YOU WILL BE GIVEN A 13 HOUR PREMEDICATION PREP.  1) Do not eat or drink anything after 5:00am (4 hours prior to your test) 2) You have been given 2 bottles of oral contrast to drink. The solution may taste better if refrigerated, but do NOT add ice or any other liquid to this solution. Shake well before drinking.    Drink 1 bottle of contrast @ 7:00am (2 hours prior to your exam)  Drink 1 bottle of contrast @ 8:00am (1 hour prior to your exam)  You may take any medications as prescribed with a small amount of water except for the following: Metformin, Glucophage, Glucovance, Avandamet, Riomet, Fortamet, Actoplus Met, Janumet, Glumetza or Metaglip. The above medications must be held the day of the exam AND 48 hours after the exam.  The purpose of you drinking the oral contrast is to aid in the visualization of your intestinal tract. The contrast solution may cause some diarrhea. Before your exam is started, you will be given a small amount of fluid to drink. Depending on your individual set of symptoms, you may also receive an intravenous injection of x-ray contrast/dye. Plan on being at [facility] for 30 minutes or long, depending on the type of exam you are having performed.  If you have any questions regarding your exam or if you need to reschedule, you may call the CT department at [phone] between the hours of 8:00 am and 5:00 pm, Monday-Friday.  ________________________________________________________________________  Micheal Jones have  been scheduled for an endoscopy with propofol. Please follow written instructions given to you at your visit today. If you use inhalers (even only as needed) or a CPAP machine, please bring them with you on the day of your procedure.  Thank you for choosing me and [facility].  Micheal Jones. Micheal Jones., MD., Micheal Jones

## 2013-01-19 ENCOUNTER — Inpatient Hospital Stay: Admission: RE | Admit: 2013-01-19 | Payer: BC Managed Care – PPO | Source: Ambulatory Visit

## 2013-01-26 ENCOUNTER — Ambulatory Visit (INDEPENDENT_AMBULATORY_CARE_PROVIDER_SITE_OTHER)
Admission: RE | Admit: 2013-01-26 | Discharge: 2013-01-26 | Disposition: A | Discharge status: Home / Self Care | Payer: BC Managed Care – PPO | Source: Ambulatory Visit | Attending: Gastroenterology | Admitting: Gastroenterology

## 2013-01-26 DIAGNOSIS — K7689 Other specified diseases of liver: Secondary | ICD-10-CM

## 2013-01-26 DIAGNOSIS — R1012 Left upper quadrant pain: Secondary | ICD-10-CM

## 2013-01-26 MED ORDER — IOHEXOL 300 MG/ML  SOLN
100.0000 mL | Freq: Once | INTRAMUSCULAR | Status: AC | PRN
  Administered 2013-01-26: 100 mL via INTRAVENOUS

## 2013-01-27 ENCOUNTER — Other Ambulatory Visit: Payer: Self-pay | Admitting: Family Medicine

## 2013-02-07 ENCOUNTER — Other Ambulatory Visit: Payer: Self-pay | Admitting: Family Medicine

## 2013-02-07 NOTE — Telephone Encounter (Signed)
Electronic refill request.  Please advise. 

## 2013-02-07 NOTE — Telephone Encounter (Signed)
Please call in

## 2013-02-08 NOTE — Telephone Encounter (Signed)
Rx phoned to pharmacy.  

## 2013-02-20 ENCOUNTER — Other Ambulatory Visit: Payer: Self-pay | Admitting: Family Medicine

## 2013-02-22 ENCOUNTER — Encounter: Payer: Self-pay | Admitting: Gastroenterology

## 2013-02-22 ENCOUNTER — Ambulatory Visit (AMBULATORY_SURGERY_CENTER): Payer: BC Managed Care – PPO | Admitting: Gastroenterology

## 2013-02-22 VITALS — BP 122/76 | HR 71 | Temp 97.7°F | Resp 17 | Ht 67.0 in | Wt 189.0 lb

## 2013-02-22 DIAGNOSIS — R1012 Left upper quadrant pain: Secondary | ICD-10-CM

## 2013-02-22 DIAGNOSIS — K7689 Other specified diseases of liver: Secondary | ICD-10-CM

## 2013-02-22 DIAGNOSIS — R079 Chest pain, unspecified: Secondary | ICD-10-CM

## 2013-02-22 MED ORDER — OMEPRAZOLE 20 MG PO CPDR: 20.0000 mg | DELAYED_RELEASE_CAPSULE | Freq: Every day | ORAL | Status: DC

## 2013-02-22 MED ORDER — SODIUM CHLORIDE 0.9 % IV SOLN: 500.0000 mL | INTRAVENOUS | Status: DC

## 2013-02-22 NOTE — Progress Notes (Signed)
Patient stating he was very nauseated following the use of "numbing spray", Cetacaine, with last procedure. Patient questioning if it is necessary to use Cetacaine with this procedure. Nurse Anethetist informed.

## 2013-02-22 NOTE — Progress Notes (Signed)
Patient did not experience any of the following events: a burn prior to discharge; a fall within the facility; wrong site/side/patient/procedure/implant event; or a hospital transfer or hospital admission upon discharge from the facility. (G8907) Patient did not have preoperative order for IV antibiotic SSI prophylaxis. (G8918)  

## 2013-02-22 NOTE — Patient Instructions (Addendum)
YOU HAD AN ENDOSCOPIC PROCEDURE TODAY AT THE [facility]: Refer to the procedure report that was given to you for any specific questions about what was found during the examination.  If the procedure report does not answer your questions, please call your gastroenterologist to clarify.  If you requested that your care partner not be given the details of your procedure findings, then the procedure report has been included in a sealed envelope for you to review at your convenience later.  YOU SHOULD EXPECT: Some feelings of bloating in the abdomen. Passage of more gas than usual.  Walking can help get rid of the air that was put into your GI tract during the procedure and reduce the bloating. If you had a lower endoscopy (such as a colonoscopy or flexible sigmoidoscopy) you may notice spotting of blood in your stool or on the toilet paper. If you underwent a bowel prep for your procedure, then you may not have a normal bowel movement for a few days.  DIET: Your first meal following the procedure should be a light meal and then it is ok to progress to your normal diet.  A half-sandwich or bowl of soup is an example of a good first meal.  Heavy or fried foods are harder to digest and may make you feel nauseous or bloated.  Likewise meals heavy in dairy and vegetables can cause extra gas to form and this can also increase the bloating.  Drink plenty of fluids but you should avoid alcoholic beverages for 24 hours.  ACTIVITY: Your care partner should take you home directly after the procedure.  You should plan to take it easy, moving slowly for the rest of the day.  You can resume normal activity the day after the procedure however you should NOT DRIVE or use heavy machinery for 24 hours (because of the sedation medicines used during the test).    SYMPTOMS TO REPORT IMMEDIATELY: A gastroenterologist can be reached at any hour.  During normal business hours, 8:30 AM to 5:00 PM Monday through Friday,  call [phone].  After hours and on weekends, please call the GI answering service at [phone] who will take a message and have the physician on call contact you.   Following lower endoscopy (colonoscopy or flexible sigmoidoscopy):  Excessive amounts of blood in the stool  Significant tenderness or worsening of abdominal pains  Swelling of the abdomen that is new, acute  Fever of 100F or higher  Following upper endoscopy (EGD)  Vomiting of blood or coffee ground material  New chest pain or pain under the shoulder blades  Painful or persistently difficult swallowing  New shortness of breath  Fever of 100F or higher  Black, tarry-looking stools  FOLLOW UP: If any biopsies were taken you will be contacted by phone or by letter within the next 1-3 weeks.  Call your gastroenterologist if you have not heard about the biopsies in 3 weeks.  Our staff will call the home number listed on your records the next business day following your procedure to check on you and address any questions or concerns that you may have at that time regarding the information given to you following your procedure. This is a courtesy call and so if there is no answer at the home number and we have not heard from you through the emergency physician on call, we will assume that you have returned to your regular daily activities without incident.  SIGNATURES/CONFIDENTIALITY: You and/or your care   partner have signed paperwork which will be entered into your electronic medical record.  These signatures attest to the fact that that the information above on your After Visit Summary has been reviewed and is understood.  Full responsibility of the confidentiality of this discharge information lies with you and/or your care-partner.  

## 2013-02-22 NOTE — Progress Notes (Signed)
Lidocaine-40mg IV prior to Propofol InductionPropofol given over incremental dosages 

## 2013-02-22 NOTE — Op Note (Signed)
[facility] [address] [place], [address]   ENDOSCOPY PROCEDURE REPORT  PATIENT: Micheal, Jones  MR#: [id number] BIRTHDATE: [birth year] , 56  yrs. old GENDER: Male ENDOSCOPIST: Meryl Dare, MD, [facility] PROCEDURE DATE:  02/22/2013 PROCEDURE:  EGD, diagnostic ASA CLASS:     Class II INDICATIONS:  Chest pain.   abdominal pain in upper left quadrant. MEDICATIONS: MAC sedation, administered by CRNA and propofol (Diprivan) 100mg  IV TOPICAL ANESTHETIC: none DESCRIPTION OF PROCEDURE: After the risks benefits and alternatives of the procedure were thoroughly explained, informed consent was obtained.  The LB GIF-H180 [id number] endoscope was introduced through the mouth and advanced to the second portion of the duodenum without limitations.  The instrument was slowly withdrawn as the mucosa was fully examined.  DUODENUM: Mild duodenal inflammation was found in the duodenal bulb. The duodenal mucosa showed no abnormalities in the 2nd part of the duodenum. STOMACH: The mucosa and folds of the stomach appeared normal. ESOPHAGUS: The mucosa of the esophagus appeared normal.  Retroflexed views revealed no abnormalities.     The scope was then withdrawn from the patient and the procedure completed.  COMPLICATIONS: There were no complications.  ENDOSCOPIC IMPRESSION: 1.   Mild Duodenitis in the duodenal bulb  RECOMMENDATIONS: 1.  PPI qam: omeprazole 20 mg po qam for 8 weeks 2. No cause for chest or LUQ pain found, follow up with PCP   eSigned:  Meryl Dare, MD, [place] 02/22/2013 9:14 AM

## 2013-02-23 ENCOUNTER — Telehealth: Payer: Self-pay

## 2013-02-23 NOTE — Telephone Encounter (Signed)
Left message on answering machine. 

## 2013-03-16 ENCOUNTER — Other Ambulatory Visit: Payer: Self-pay | Admitting: Family Medicine

## 2013-03-21 ENCOUNTER — Other Ambulatory Visit: Payer: Self-pay | Admitting: Family Medicine

## 2013-03-21 NOTE — Telephone Encounter (Signed)
Electronic refill request.  Please advise. 

## 2013-03-22 NOTE — Telephone Encounter (Signed)
Medication phoned to pharmacy.  

## 2013-03-22 NOTE — Telephone Encounter (Signed)
Please call in

## 2013-04-28 ENCOUNTER — Other Ambulatory Visit: Payer: Self-pay | Admitting: Family Medicine

## 2013-04-29 NOTE — Telephone Encounter (Signed)
Please call in.  Due for CPE.

## 2013-04-29 NOTE — Telephone Encounter (Signed)
Medication phoned to pharmacy. Left detailed message on voicemail of cell phone to schedule CPE.

## 2013-06-01 ENCOUNTER — Encounter: Payer: Self-pay | Admitting: Radiology

## 2013-06-02 ENCOUNTER — Encounter: Payer: Self-pay | Admitting: Family Medicine

## 2013-06-02 ENCOUNTER — Ambulatory Visit (INDEPENDENT_AMBULATORY_CARE_PROVIDER_SITE_OTHER): Payer: BC Managed Care – PPO | Admitting: Family Medicine

## 2013-06-02 VITALS — BP 110/70 | HR 82 | Temp 97.7°F | Wt 190.8 lb

## 2013-06-02 DIAGNOSIS — K760 Fatty (change of) liver, not elsewhere classified: Secondary | ICD-10-CM

## 2013-06-02 DIAGNOSIS — R1012 Left upper quadrant pain: Secondary | ICD-10-CM

## 2013-06-02 DIAGNOSIS — K7689 Other specified diseases of liver: Secondary | ICD-10-CM

## 2013-06-02 MED ORDER — SIMVASTATIN 20 MG PO TABS: ORAL_TABLET | ORAL | Status: DC

## 2013-06-02 NOTE — Patient Instructions (Addendum)
Stop the simvastatin for now and give me an update in about 2 weeks, sooner if needed.  Take care.

## 2013-06-02 NOTE — Progress Notes (Signed)
Still with LUQ pain.  Prev with gastritis on EGD, treated with PPI.  CT scan and u/s with fatty liver.  No help with an interventions so far.  Has been going on for 6 months overall.  Pain can be intermittent, has been more regular recently.  Onset is irregular.  Can have pain at rest.  Pain with or without food intake.  Can happen at night, when he wakes at night.  "It hurts when it wants to hurt."  Feels deep to the skin.   "It's like I have a broken rib."  No fx noted on Ct prev and th pain is inferior to the ribs on L side. Taking a deep will occ but not always cause pain.  No rash.  No clear source per patient for his sx.  Spleen was normal on CT prev.  He has played golf w/o pain.    We talked about his fatty liver.  He is on a statin and we discussed that.    Meds, vitals, and allergies reviewed.   ROS: See HPI.  Otherwise, noncontributory.  GEN: nad, alert and oriented HEENT: mucous membranes moist NECK: supple w/o LA CV: rrr. PULM: ctab, no inc wob ABD: soft, +bs, not ttp in the LUQ on exam.  No pain with abd wall testing EXT: no edema SKIN: no acute rash

## 2013-06-03 DIAGNOSIS — K760 Fatty (change of) liver, not elsewhere classified: Secondary | ICD-10-CM | POA: Insufficient documentation

## 2013-06-03 NOTE — Assessment & Plan Note (Signed)
Unclear if fatty liver is related- likely unrelated.  Needs to lose weight, discussed fatty liver and tx.  Possible statin cause? Hold statin and report back.  No clear cause o/w.  Normal exam except for overweight.  No ominous dx likely.   >25 min spent with face to face with patient, >50% counseling and/or coordinating care.

## 2013-06-04 ENCOUNTER — Other Ambulatory Visit: Payer: Self-pay | Admitting: Family Medicine

## 2013-06-27 ENCOUNTER — Telehealth: Payer: Self-pay

## 2013-06-27 NOTE — Telephone Encounter (Signed)
I misspoke n the prev note.  I called pt.  He has seen GI, but it was ~6 months ago.   We talked about his sx.  With the GI eval done, MSK source is likely.  We agreed to keep him off the statin for now, he'll monitor the sx and then we'll discuss at the upcoming visit.  He'll notify me if worsening in the meantime.

## 2013-06-27 NOTE — Telephone Encounter (Signed)
Patient notified as instructed by telephone. Was advised by patient that he has seen a GI doctor and the only thing that they found was a fatty liver. The GI doctor told him that would not be causing the pain that he is having and that is why he came back to Dr. Para March.

## 2013-06-27 NOTE — Telephone Encounter (Signed)
It doesn't appear to be related to the simvastatin.  I see two options.  One is to do nothing else for now other than observation and see if this gradually resolves on its own (since it is improving).  The other option is to refer him over to GI (given the location of the pain) for a second opinion. I'm okay with either.

## 2013-06-27 NOTE — Telephone Encounter (Signed)
Pt seen 06/02/13 stopped simvastatin, pt is still having sharp pain under lt rib on and off; pt said pain not as bad or as frequent; pt has pain now twice weekly and pain last 3-5 mins. When has pain pain level is an 8. [facility] [address]Please advise.

## 2013-06-29 ENCOUNTER — Other Ambulatory Visit: Payer: Self-pay | Admitting: Family Medicine

## 2013-06-29 MED ORDER — LEVOTHYROXINE SODIUM 112 MCG PO TABS: 112.0000 ug | ORAL_TABLET | Freq: Every day | ORAL | Status: DC

## 2013-06-29 NOTE — Telephone Encounter (Signed)
Pt requested 90 day supply.

## 2013-07-27 ENCOUNTER — Encounter: Payer: Self-pay | Admitting: Radiology

## 2013-07-28 ENCOUNTER — Encounter: Payer: Self-pay | Admitting: Family Medicine

## 2013-07-28 ENCOUNTER — Ambulatory Visit (INDEPENDENT_AMBULATORY_CARE_PROVIDER_SITE_OTHER): Payer: BC Managed Care – PPO | Admitting: Family Medicine

## 2013-07-28 VITALS — BP 102/76 | HR 81 | Temp 97.7°F | Ht 68.0 in | Wt 189.5 lb

## 2013-07-28 DIAGNOSIS — I10 Essential (primary) hypertension: Secondary | ICD-10-CM

## 2013-07-28 DIAGNOSIS — E039 Hypothyroidism, unspecified: Secondary | ICD-10-CM

## 2013-07-28 DIAGNOSIS — M549 Dorsalgia, unspecified: Secondary | ICD-10-CM

## 2013-07-28 DIAGNOSIS — R109 Unspecified abdominal pain: Secondary | ICD-10-CM

## 2013-07-28 DIAGNOSIS — E785 Hyperlipidemia, unspecified: Secondary | ICD-10-CM

## 2013-07-28 DIAGNOSIS — E7800 Pure hypercholesterolemia, unspecified: Secondary | ICD-10-CM

## 2013-07-28 DIAGNOSIS — R1012 Left upper quadrant pain: Secondary | ICD-10-CM

## 2013-07-28 DIAGNOSIS — Z0000 Encounter for general adult medical examination without abnormal findings: Secondary | ICD-10-CM

## 2013-07-28 LAB — CBC WITH DIFFERENTIAL/PLATELET
Basophils Absolute: 0.1 K/uL (ref 0.0–0.1)
Basophils Relative: 0.7 % (ref 0.0–3.0)
Eosinophils Absolute: 0.3 K/uL (ref 0.0–0.7)
Eosinophils Relative: 4 % (ref 0.0–5.0)
HCT: 47.2 % (ref 39.0–52.0)
Hemoglobin: 16.1 g/dL (ref 13.0–17.0)
Lymphocytes Relative: 35.5 % (ref 12.0–46.0)
Lymphs Abs: 2.6 K/uL (ref 0.7–4.0)
MCHC: 34.2 g/dL (ref 30.0–36.0)
MCV: 92.7 fl (ref 78.0–100.0)
Monocytes Absolute: 0.4 K/uL (ref 0.1–1.0)
Monocytes Relative: 5.9 % (ref 3.0–12.0)
Neutro Abs: 3.9 K/uL (ref 1.4–7.7)
Neutrophils Relative %: 53.9 % (ref 43.0–77.0)
Platelets: 221 K/uL (ref 150.0–400.0)
RBC: 5.09 Mil/uL (ref 4.22–5.81)
RDW: 13.6 % (ref 11.5–14.6)
WBC: 7.2 K/uL (ref 4.5–10.5)

## 2013-07-28 LAB — POCT URINALYSIS DIPSTICK
Bilirubin, UA: NEGATIVE
Leukocytes, UA: NEGATIVE
Nitrite, UA: NEGATIVE
Protein, UA: NEGATIVE
Urobilinogen, UA: NEGATIVE
pH, UA: 6

## 2013-07-28 LAB — LIPID PANEL
Cholesterol: 235 mg/dL — ABNORMAL HIGH (ref 0–200)
HDL: 38.9 mg/dL — ABNORMAL LOW (ref >39.00)
Total CHOL/HDL Ratio: 6
Triglycerides: 166 mg/dL — ABNORMAL HIGH (ref 0.0–149.0)
VLDL: 33.2 mg/dL (ref 0.0–40.0)

## 2013-07-28 LAB — COMPREHENSIVE METABOLIC PANEL
BUN: 12 mg/dL (ref 6–23)
CO2: 30 mEq/L (ref 19–32)
Calcium: 9 mg/dL (ref 8.4–10.5)
Chloride: 104 mEq/L (ref 96–112)
Creatinine, Ser: 1 mg/dL (ref 0.4–1.5)
GFR: 83.05 mL/min (ref >60.00)
Total Bilirubin: 1 mg/dL (ref 0.3–1.2)

## 2013-07-28 LAB — TSH: TSH: 2.65 u[IU]/mL (ref 0.35–5.50)

## 2013-07-28 MED ORDER — ALPRAZOLAM 0.5 MG PO TABS: ORAL_TABLET | ORAL | Status: DC

## 2013-07-28 MED ORDER — NITROGLYCERIN 0.4 MG SL SUBL: 0.4000 mg | SUBLINGUAL_TABLET | SUBLINGUAL | Status: DC | PRN

## 2013-07-28 MED ORDER — ZOLPIDEM TARTRATE 10 MG PO TABS: ORAL_TABLET | ORAL | Status: DC

## 2013-07-28 MED ORDER — CITALOPRAM HYDROBROMIDE 20 MG PO TABS: ORAL_TABLET | ORAL | Status: DC

## 2013-07-28 NOTE — Patient Instructions (Addendum)
Go to the lab on the way out.  We'll contact you with your lab report.  We may need to set you up with ortho.  We'll be in touch.  Take care.   I would get a flu shot each fall.

## 2013-07-28 NOTE — Progress Notes (Signed)
CPE- See plan.  Routine anticipatory guidance given to patient.  See health maintenance. Tetanus 2013 Flu shot done at work.   Colonoscopy 2009 Prostate cancer screening and PSA options (with potential risks and benefits of testing vs not testing) were discussed along with recent recs/guidelines.  He declined testing PSA at this point. He'll consider it for later.   Diet and exercise d/w pt.  Encouraged both.  Some yard work, some walking, encouraged more.   Living will d/w pt.  Encouraged.  Would have his sister Duanne Guess designated if incapacitated.   Hypothyroid. No ADE, no dysphagia.  No neck mass.  Due for labs.   Hypertension:    Using medication without problems or lightheadedness: yes Chest pain with exertion:no Edema:no Short of breath:no  Elevated Cholesterol: Using medications without problems: off meds.  It didn't affect the abd pain.  Muscle aches: see below Diet compliance: see above Exercise: see above  Still with episodic pain in the LUQ, a few cm below the L anterior lateral ribs. Trouble getting comfortable.  Intermittent.  Position doesn't affect it.  No FCNAVD.  Prev saw GI, no cause seen.  No change with food, fasting.  This has been going on about 1 year.  Dull ache.  Prev CT unremarkable.    PMH and SH reviewed  Meds, vitals, and allergies reviewed.   ROS: See HPI.  Otherwise negative.    GEN: nad, alert and oriented HEENT: mucous membranes moist NECK: supple w/o LA CV: rrr. PULM: ctab, no inc wob ABD: soft, +bs, no ttp EXT: no edema SKIN: no acute rash Back not ttp in midline.

## 2013-07-29 ENCOUNTER — Encounter: Payer: Self-pay | Admitting: *Deleted

## 2013-07-29 NOTE — Assessment & Plan Note (Signed)
Would restart statin.  Stopping med didn't affect his abd pain.  See notes on labs.

## 2013-07-29 NOTE — Assessment & Plan Note (Signed)
Routine anticipatory guidance given to patient.  See health maintenance. Tetanus 2013 Flu shot done at work.   Colonoscopy 2009 Prostate cancer screening and PSA options (with potential risks and benefits of testing vs not testing) were discussed along with recent recs/guidelines.  He declined testing PSA at this point. He'll consider it for later.   Diet and exercise d/w pt.  Encouraged both.  Some yard work, some walking, encouraged more.   Living will d/w pt.  Encouraged.  Would have his sister Duanne Guess designated if incapacitated.

## 2013-07-29 NOTE — Assessment & Plan Note (Signed)
Controlled, continue current meds.   

## 2013-07-29 NOTE — Assessment & Plan Note (Signed)
Still with episodic pain in the LUQ, a few cm below the L anterior lateral ribs. Trouble getting comfortable. Intermittent. Position doesn't affect it. No FCNAVD. Prev saw GI, no cause seen. No change with food, fasting. This has been going on about 1 year. Dull ache. Prev CT unremarkable.  No stones on CT or prev u/s.  Not ttp today.  I wonder if this could be referred from his back.  It doesn't seem to be intraabdominal.  Would ask for ortho input.  I didn't get plain films as he prev had CT done.  D/w pt.  He agrees.

## 2013-08-09 ENCOUNTER — Encounter: Payer: Self-pay | Admitting: Family Medicine

## 2013-10-05 ENCOUNTER — Other Ambulatory Visit: Payer: Self-pay | Admitting: Family Medicine

## 2013-10-05 NOTE — Telephone Encounter (Signed)
Thank you.  I thought the old rx must have been voided and thus the request.

## 2013-10-05 NOTE — Telephone Encounter (Signed)
Received refill request electronically. Last office visit 07/28/13. Is it okay to refill medication? 

## 2013-10-05 NOTE — Telephone Encounter (Signed)
Please call in.  Thanks.   

## 2013-10-05 NOTE — Telephone Encounter (Signed)
Called refill to pharmacy as instructed. Was advised by [facility] F[facility] that patient just got this filled 09/11/13 and it is too soon and she will let the patient know. Albin Felling stated that the patient already has refills on hold. Albin Felling stated that he has a script on 08/24/13 with 5 refills and one on 09/30/13 with one refill. Albin Felling stated that she will explain this to the patient.

## 2013-10-06 ENCOUNTER — Other Ambulatory Visit: Payer: Self-pay | Admitting: Family Medicine

## 2013-10-06 NOTE — Telephone Encounter (Signed)
Should have already been done, see prev request.

## 2013-10-06 NOTE — Telephone Encounter (Signed)
Electronic refill request.  Please advise. 

## 2013-10-07 NOTE — Telephone Encounter (Signed)
Electronic refill request.  Please advise. 

## 2013-10-09 NOTE — Telephone Encounter (Signed)
See prev request and see below.  It looks like this was already done.

## 2013-10-10 NOTE — Telephone Encounter (Signed)
Please check the previous notes. It looks like this was already done.   I signed off on this last week as a phone-in.  If it's already been done, then mark it as done and sign off.

## 2013-10-10 NOTE — Telephone Encounter (Signed)
Electronic refill request.  Please advise. 

## 2013-10-11 ENCOUNTER — Other Ambulatory Visit: Payer: Self-pay | Admitting: *Deleted

## 2013-12-25 ENCOUNTER — Other Ambulatory Visit: Payer: Self-pay | Admitting: Family Medicine

## 2013-12-25 ENCOUNTER — Telehealth: Payer: Self-pay | Admitting: Family Medicine

## 2013-12-25 DIAGNOSIS — D1809 Hemangioma of other sites: Secondary | ICD-10-CM

## 2013-12-25 DIAGNOSIS — R109 Unspecified abdominal pain: Secondary | ICD-10-CM

## 2013-12-25 NOTE — Telephone Encounter (Signed)
Notify pt. Note from Dr. Lynann Bologna reviewed.  He does have an atypical hemangioma in his back and we can refer over to heme/onc for eval/consult.  That wouldn't be unreasonable.  Also mentioned gen surgery re: the left sided abd pain.  If he wants either referral, then let me know. Thanks.

## 2013-12-26 NOTE — Telephone Encounter (Signed)
Left message on voicemail and answering machine to call back.

## 2013-12-28 NOTE — Telephone Encounter (Signed)
Left message on voicemail to call back, 

## 2013-12-29 NOTE — Telephone Encounter (Signed)
Patient advised.  He would like both referrals.

## 2013-12-29 NOTE — Telephone Encounter (Signed)
Referrals are in. Thanks!

## 2014-01-27 ENCOUNTER — Ambulatory Visit (INDEPENDENT_AMBULATORY_CARE_PROVIDER_SITE_OTHER): Payer: Self-pay | Admitting: Surgery

## 2014-02-06 ENCOUNTER — Ambulatory Visit (INDEPENDENT_AMBULATORY_CARE_PROVIDER_SITE_OTHER): Payer: BC Managed Care – PPO | Admitting: Surgery

## 2014-02-06 ENCOUNTER — Encounter (INDEPENDENT_AMBULATORY_CARE_PROVIDER_SITE_OTHER): Payer: Self-pay | Admitting: Surgery

## 2014-02-06 VITALS — BP 118/70 | HR 80 | Temp 97.2°F | Resp 16 | Ht 68.0 in | Wt 186.0 lb

## 2014-02-06 DIAGNOSIS — R1012 Left upper quadrant pain: Secondary | ICD-10-CM

## 2014-02-06 NOTE — Progress Notes (Signed)
Patient ID: Micheal Jones, male   DOB: 1956/09/23, 58 y.o.   MRN: [id number]  Chief Complaint  Patient presents with  . New Evaluation    New eval for abd pain    HPI Micheal Jones is a 58 y.o. male.  Patient sat at request of Dr. Damita Dunnings for a left upper quadrant and costal margin pain. The pain is intermittent. It has been present for 2 years. It comes and goes and is mild in intensity. It is usually dull in a but can be sharp time to time. Nothing makes it better or worse. Not related to physical activity. Not related to eating. No history of rib fracture or trauma. No history of pulmonary disease. HPI  Past Medical History  Diagnosis Date  . Depression   . Hyperlipidemia   . Hypertension   . Hypothyroidism   . Insomnia   . GERD (gastroesophageal reflux disease)   . Anxiety   . Renal stones   . Achilles tendon tear     right, partial, no surgery  . Arthritis     C and L spine  . Sleep apnea     occ use of CPAP, trouble with mask fit    Past Surgical History  Procedure Laterality Date  . Tonsillectomy    . Thyroidectomy      partia  . Lithotripsy      x 2    Family History  Problem Relation Age of Onset  . Stroke Mother   . Coronary artery disease Mother   . Coronary artery disease Brother   . Suicidality Father   . Colon cancer Neg Hx   . Prostate cancer Neg Hx     Social History History  Substance Use Topics  . Smoking status: Never Smoker   . Smokeless tobacco: Never Used  . Alcohol Use: Yes     Comment: occ- 3-4 per week    No Known Allergies  Current Outpatient Prescriptions  Medication Sig Dispense Refill  . ALPRAZolam (XANAX) 0.5 MG tablet TAKE 1 TABLET IN THE MORNING, MIDAFTERNOON, AND EVERY NIGHT  90 tablet  5  . aspirin 81 MG tablet Take 81 mg by mouth daily.      . citalopram (CELEXA) 20 MG tablet TAKE 1 TABLET EVERY DAY  90 tablet  3  . diclofenac (VOLTAREN) 75 MG EC tablet Take 75 mg by mouth 2 (two) times daily.      Marland Kitchen levothyroxine  (SYNTHROID, LEVOTHROID) 112 MCG tablet TAKE 1 TABLET (112 MCG TOTAL) BY MOUTH DAILY BEFORE BREAKFAST.  90 tablet  1  . nitroGLYCERIN (NITROSTAT) 0.4 MG SL tablet Place 1 tablet (0.4 mg total) under the tongue every 5 (five) minutes as needed for chest pain.  25 tablet  3  . simvastatin (ZOCOR) 20 MG tablet Held as of 06/02/13      . valsartan (DIOVAN) 80 MG tablet Take 80 mg by mouth daily.      Marland Kitchen zolpidem (AMBIEN) 10 MG tablet TAKE 1 TABLET BY MOUTH AT BEDTIME AS NEEDED  30 tablet  5  . DIOVAN 80 MG tablet TAKE 1 TABLET EVERY DAY  90 tablet  3   No current facility-administered medications for this visit.    Review of Systems Review of Systems  Constitutional: Negative for fever, chills and unexpected weight change.  HENT: Negative for congestion, hearing loss, sore throat, trouble swallowing and voice change.   Eyes: Negative for visual disturbance.  Respiratory: Negative for cough and wheezing.  Cardiovascular: Negative for chest pain, palpitations and leg swelling.  Gastrointestinal: Positive for abdominal pain. Negative for nausea, vomiting, diarrhea, constipation, blood in stool, abdominal distention, anal bleeding and rectal pain.  Genitourinary: Negative for hematuria and difficulty urinating.  Musculoskeletal: Negative for arthralgias.  Skin: Negative for rash and wound.  Neurological: Negative for seizures, syncope, weakness and headaches.  Hematological: Negative for adenopathy. Does not bruise/bleed easily.  Psychiatric/Behavioral: Negative for confusion.    Blood pressure 118/70, pulse 80, temperature 97.2 F (36.2 C), resp. rate 16, height 5\' 8"  (1.727 m), weight 186 lb (84.369 kg).  Physical Exam Physical Exam  Constitutional: He is oriented to person, place, and time. He appears well-developed and well-nourished.  HENT:  Head: Normocephalic and atraumatic.  Eyes: EOM are normal. Pupils are equal, round, and reactive to light.  Neck: Normal range of motion. Neck  supple.  Cardiovascular: Normal rate and regular rhythm.   Pulmonary/Chest: Effort normal and breath sounds normal.  Abdominal: Soft. He exhibits no distension and no mass. There is no tenderness. There is no rebound and no guarding. No hernia.  Musculoskeletal: Normal range of motion.  Lymphadenopathy:    He has no cervical adenopathy.  Neurological: He is alert and oriented to person, place, and time.  Skin: Skin is warm and dry.  Psychiatric: He has a normal mood and affect. His behavior is normal. Judgment and thought content normal.    Data Reviewed Dr. Josefine Class notes, abdominal pelvic CT scan from 2014, Dr. Fuller Plan note.  Assessment    Left costal margin abdominal pain    Plan    No obvious source of pain identified after reviewing the patient's history, diagnostic workup and physical exam. Recommend increasing physical activity, weight loss, core strengthening exercises. No surgically correctable cause noted. Followup as needed.       Lysette Lindenbaum A. 02/06/2014, 1:00 PM

## 2014-02-06 NOTE — Patient Instructions (Signed)
Return as needed. Increase physical activity and strengthen core.

## 2014-02-20 ENCOUNTER — Telehealth: Payer: Self-pay | Admitting: Hematology and Oncology

## 2014-02-20 NOTE — Telephone Encounter (Signed)
LEFT MESSAGE FOR PATIENT AND GAVE NEW PATIENT APPT FOR 04/13 @ 9:45 W/DR/ [place].  [place] PACKET MAILED.

## 2014-02-21 ENCOUNTER — Telehealth: Payer: Self-pay | Admitting: Hematology and Oncology

## 2014-02-21 NOTE — Telephone Encounter (Signed)
C/D 02/21/14 for appt. 02/27/14

## 2014-02-27 ENCOUNTER — Encounter: Payer: Self-pay | Admitting: Hematology and Oncology

## 2014-02-27 ENCOUNTER — Telehealth: Payer: Self-pay | Admitting: Family Medicine

## 2014-02-27 ENCOUNTER — Telehealth: Payer: Self-pay | Admitting: Hematology and Oncology

## 2014-02-27 ENCOUNTER — Ambulatory Visit (HOSPITAL_BASED_OUTPATIENT_CLINIC_OR_DEPARTMENT_OTHER): Payer: BC Managed Care – PPO | Admitting: Hematology and Oncology

## 2014-02-27 ENCOUNTER — Ambulatory Visit: Payer: BC Managed Care – PPO

## 2014-02-27 VITALS — BP 148/85 | HR 78 | Temp 98.3°F | Resp 18 | Ht 68.0 in | Wt 188.2 lb

## 2014-02-27 DIAGNOSIS — D1800 Hemangioma unspecified site: Secondary | ICD-10-CM

## 2014-02-27 DIAGNOSIS — D320 Benign neoplasm of cerebral meninges: Secondary | ICD-10-CM

## 2014-02-27 DIAGNOSIS — R1012 Left upper quadrant pain: Secondary | ICD-10-CM

## 2014-02-27 NOTE — Progress Notes (Signed)
Checked in new patient with no financial issues. He has not been out of country. °

## 2014-02-27 NOTE — Telephone Encounter (Signed)
The MRI was done by Dr. Lynann Bologna at [facility].  Please have onc get in touch with them so they can send the MRI directly over.  Thanks.

## 2014-02-27 NOTE — Telephone Encounter (Signed)
Dr. Calton Dach RN from [facility] is calling and wanting to know where they can get the MRI results that pt recently had and the disc. She says she has called everywhere and no one seems to have it ??? I looked in the chart and so has she and we can't find it.  Please call Cameo RN [phone] fax number is [phone].

## 2014-02-27 NOTE — Telephone Encounter (Signed)
err

## 2014-02-27 NOTE — Progress Notes (Signed)
[place] NOTE  Patient Care Team: Tonia Ghent, MD as PCP - General (Family Medicine)  CHIEF COMPLAINTS/PURPOSE OF CONSULTATION:  Left upper quadrant pain, atypical meningioma on recent MRI  HISTORY OF PRESENTING ILLNESS:  Micheal Jones 58 y.o. male is here because of complaints of intermittent left upper quadrant pain for the last year and a half. The pain is located in the left csotal margin, described as a sharp pain, not precipitated by any activities. It comes and goes almost on a weekly basis. He does not take any pain medicine for it. He had numerous investigations including GI evaluation, CT scan and MRI. At present time, the patient is not in any pain. On 01/26/2013, CT scan of the abdomen and pelvis showed fatty liver disease. On 02/22/2013, upper endoscopy reveals some mild duodenitis but nothing that could explain his pain. He was referred with orthopedic surgeon and underwent MRI evaluation. I do not have copy of that report but the patient was told there were some atypical hemangioma seen and he was being referred here for further evaluation.  MEDICAL HISTORY:  Past Medical History  Diagnosis Date  . Depression   . Hyperlipidemia   . Hypertension   . Hypothyroidism   . Insomnia   . GERD (gastroesophageal reflux disease)   . Anxiety   . Renal stones   . Achilles tendon tear     right, partial, no surgery  . Arthritis     C and L spine  . Sleep apnea     occ use of CPAP, trouble with mask fit    SURGICAL HISTORY: Past Surgical History  Procedure Laterality Date  . Tonsillectomy    . Thyroidectomy      [place]  . Lithotripsy      x 2    SOCIAL HISTORY: History   Social History  . Marital Status: Single    Spouse Name: N/A    Number of Children: 1  . Years of Education: N/A   Occupational History  . SYSTEM ANALYST [employer]   Social History Main Topics  . Smoking status: Never Smoker   . Smokeless tobacco: Never Used  .  Alcohol Use: Yes     Comment: occ- 3-4 per week  . Drug Use: No  . Sexual Activity: Not on file   Other Topics Concern  . Not on file   Social History Narrative   [employer]- system programming- works in [place]   Divorced 2009   1 daughter, split custody    FAMILY HISTORY: Family History  Problem Relation Age of Onset  . Stroke Mother   . Coronary artery disease Mother   . Coronary artery disease Brother   . Suicidality Father   . Colon cancer Neg Hx   . Prostate cancer Neg Hx     ALLERGIES:  has No Known Allergies.  MEDICATIONS:  Current Outpatient Prescriptions  Medication Sig Dispense Refill  . ALPRAZolam (XANAX) 0.5 MG tablet TAKE 1 TABLET IN THE MORNING, MIDAFTERNOON, AND EVERY NIGHT  90 tablet  5  . aspirin 81 MG tablet Take 81 mg by mouth daily.      . citalopram (CELEXA) 20 MG tablet TAKE 1 TABLET EVERY DAY  90 tablet  3  . diclofenac (VOLTAREN) 75 MG EC tablet Take 75 mg by mouth 2 (two) times daily.      Marland Kitchen DIOVAN 80 MG tablet TAKE 1 TABLET EVERY DAY  90 tablet  3  . levothyroxine (SYNTHROID,  LEVOTHROID) 112 MCG tablet TAKE 1 TABLET (112 MCG TOTAL) BY MOUTH DAILY BEFORE BREAKFAST.  90 tablet  1  . nitroGLYCERIN (NITROSTAT) 0.4 MG SL tablet Place 1 tablet (0.4 mg total) under the tongue every 5 (five) minutes as needed for chest pain.  25 tablet  3  . simvastatin (ZOCOR) 20 MG tablet Take 20 mg by mouth daily.      . valsartan (DIOVAN) 80 MG tablet Take 80 mg by mouth daily.      Marland Kitchen zolpidem (AMBIEN) 10 MG tablet TAKE 1 TABLET BY MOUTH AT BEDTIME AS NEEDED  30 tablet  5   No current facility-administered medications for this visit.    REVIEW OF SYSTEMS:   Constitutional: Denies fevers, chills or abnormal night sweats Eyes: Denies blurriness of vision, double vision or watery eyes Ears, nose, mouth, throat, and face: Denies mucositis or sore throat Respiratory: Denies cough, dyspnea or wheezes Cardiovascular: Denies palpitation, chest discomfort or lower extremity  swelling Gastrointestinal:  Denies nausea, heartburn or change in bowel habits Skin: Denies abnormal skin rashes Lymphatics: Denies new lymphadenopathy or easy bruising Neurological:Denies numbness, tingling or new weaknesses Behavioral/Psych: Mood is stable, no new changes  All other systems were reviewed with the patient and are negative.  PHYSICAL EXAMINATION: ECOG PERFORMANCE STATUS: 0 - Asymptomatic  Filed Vitals:   02/27/14 1023  BP: 148/85  Pulse: 78  Temp: 98.3 F (36.8 C)  Resp: 18   Filed Weights   02/27/14 1023  Weight: 188 lb 3.2 oz (85.367 kg)    GENERAL:alert, no distress and comfortable SKIN: skin color, texture, turgor are normal, no rashes or significant lesions EYES: normal, conjunctiva are pink and non-injected, sclera clear OROPHARYNX:no exudate, no erythema and lips, buccal mucosa, and tongue normal  NECK: supple, thyroid normal size, non-tender, without nodularity LYMPH:  no palpable lymphadenopathy in the cervical, axillary or inguinal LUNGS: clear to auscultation and percussion with normal breathing effort HEART: regular rate & rhythm and no murmurs and no lower extremity edema ABDOMEN:abdomen soft, non-tender and normal bowel sounds Musculoskeletal:no cyanosis of digits and no clubbing  PSYCH: alert & oriented x 3 with fluent speech NEURO: no focal motor/sensory deficits  LABORATORY DATA:  I have reviewed the data as listed Lab Results  Component Value Date   WBC 7.2 07/28/2013   HGB 16.1 07/28/2013   HCT 47.2 07/28/2013   MCV 92.7 07/28/2013   PLT 221.0 07/28/2013    Recent Labs  07/28/13 1001  NA 138  K 4.4  CL 104  CO2 30  GLUCOSE 86  BUN 12  CREATININE 1.0  CALCIUM 9.0  PROT 7.2  ALBUMIN 4.2  AST 28  ALT 55*  ALKPHOS 64  BILITOT 1.0    RADIOGRAPHIC STUDIES: I reviewed his CT scan dated 01/26/2013 and agree with the interpretation I have personally reviewed the radiological images as listed and agreed with the findings in the  report.   ASSESSMENT  & PLAN:  #1 INTERMITTENT LEFT UPPER QUADRANT PAIN #2 ATYPICAL MENINGIOMA  I have requested the patient to sign consent of release information so we can requests the report of his recent MRI and a copy of the imaging study. I will present his case at the next hematology conference to review his imaging study I will call the patient with treatment recommendations.   All questions were answered. The patient knows to call the clinic with any problems, questions or concerns. I spent 40 minutes counseling the patient face to face. The total  time spent in the appointment was 55 minutes and more than 50% was on counseling.     Heath Lark, MD 02/27/2014 2:28 PM

## 2014-02-28 ENCOUNTER — Telehealth: Payer: Self-pay | Admitting: *Deleted

## 2014-02-28 NOTE — Telephone Encounter (Signed)
Left detailed message on voicemail.  

## 2014-02-28 NOTE — Telephone Encounter (Signed)
Called Dr. Josefine Class office yesterday to try to locate MRI which showed a hemangioma and reason for referral to dr. Alvy Bimler.  Dr. Alvy Bimler is requesting the report and CD.  Dr. Josefine Class office stated the MRI was done by Dr. Lynann Bologna.  Called Dr. Laurena Bering office yesterday and they state the last MRI pt had was last year.  Supposedly this MRI was done recently this year.   Called back over to Dr. Josefine Class office again late yesterday to see if they at least have a report of this MRI to send Dr. Alvy Bimler.  Received call back today instructing to call Dr. Laurena Bering office for the report.  Called Dr. Laurena Bering office this afternoon and s/w Matt.  He says the last MRI was done in November of last year.  There are no scans done by their office this year.   Faxed Release of information request form to Dr. Lynann Bologna at fax [phone] requesting report of most recent MRI and also MRI on CD to be sent to Dr. Alvy Bimler.

## 2014-03-07 ENCOUNTER — Telehealth: Payer: Self-pay | Admitting: Hematology and Oncology

## 2014-03-07 ENCOUNTER — Other Ambulatory Visit: Payer: Self-pay | Admitting: Family Medicine

## 2014-03-07 NOTE — Telephone Encounter (Signed)
Please call in

## 2014-03-07 NOTE — Telephone Encounter (Signed)
Received a refill request electronically from pharmacy. Last refill 10/06/13 #30/5, last office visit 07/28/13. Is it okay to refill medication?

## 2014-03-07 NOTE — Telephone Encounter (Signed)
I reviewed the patient's case at the hematology tumor board today. Overall, the appearance of the MRI is not suspicious for cancer. The radiologist recommend repeat MRI in 3-6 months to ensure stability. I reviewed the recommendation with the patient. I recommend he touch base with his primary care provider to have the imaging repeated

## 2014-03-07 NOTE — Telephone Encounter (Signed)
Medication phoned to pharmacy.  

## 2014-03-27 ENCOUNTER — Other Ambulatory Visit: Payer: Self-pay | Admitting: Orthopedic Surgery

## 2014-03-27 ENCOUNTER — Inpatient Hospital Stay
Admission: RE | Admit: 2014-03-27 | Discharge: 2014-03-27 | Disposition: A | Discharge status: Home / Self Care | Payer: Self-pay | Source: Ambulatory Visit | Attending: Orthopedic Surgery | Admitting: Orthopedic Surgery

## 2014-03-27 DIAGNOSIS — M47812 Spondylosis without myelopathy or radiculopathy, cervical region: Secondary | ICD-10-CM

## 2014-03-27 DIAGNOSIS — M47816 Spondylosis without myelopathy or radiculopathy, lumbar region: Secondary | ICD-10-CM

## 2014-04-28 ENCOUNTER — Other Ambulatory Visit: Payer: Self-pay | Admitting: Family Medicine

## 2014-06-22 ENCOUNTER — Other Ambulatory Visit: Payer: Self-pay | Admitting: Family Medicine

## 2014-07-03 ENCOUNTER — Other Ambulatory Visit: Payer: Self-pay | Admitting: Family Medicine

## 2014-07-03 NOTE — Telephone Encounter (Signed)
Electronic refill request. Last Filled:   90 tablet 0 RF on   04/06/2014.  Please advise.

## 2014-07-04 NOTE — Telephone Encounter (Signed)
Sent!

## 2014-08-17 ENCOUNTER — Other Ambulatory Visit: Payer: Self-pay | Admitting: Family Medicine

## 2014-08-17 DIAGNOSIS — Z125 Encounter for screening for malignant neoplasm of prostate: Secondary | ICD-10-CM

## 2014-08-17 DIAGNOSIS — I10 Essential (primary) hypertension: Secondary | ICD-10-CM

## 2014-08-24 ENCOUNTER — Other Ambulatory Visit (INDEPENDENT_AMBULATORY_CARE_PROVIDER_SITE_OTHER): Payer: BC Managed Care – PPO

## 2014-08-24 DIAGNOSIS — Z125 Encounter for screening for malignant neoplasm of prostate: Secondary | ICD-10-CM

## 2014-08-24 DIAGNOSIS — I10 Essential (primary) hypertension: Secondary | ICD-10-CM

## 2014-08-24 LAB — LIPID PANEL
CHOLESTEROL: 202 mg/dL — AB (ref 0–200)
HDL: 36.4 mg/dL — ABNORMAL LOW (ref >39.00)
LDL Cholesterol: 141 mg/dL — ABNORMAL HIGH (ref 0–99)
NonHDL: 165.6
Total CHOL/HDL Ratio: 6
Triglycerides: 121 mg/dL (ref 0.0–149.0)
VLDL: 24.2 mg/dL (ref 0.0–40.0)

## 2014-08-24 LAB — PSA: PSA: 1.05 ng/mL (ref 0.10–4.00)

## 2014-08-24 LAB — COMPREHENSIVE METABOLIC PANEL
ALBUMIN: 3.7 g/dL (ref 3.5–5.2)
ALT: 57 U/L — ABNORMAL HIGH (ref 0–53)
AST: 33 U/L (ref 0–37)
Alkaline Phosphatase: 66 U/L (ref 39–117)
BILIRUBIN TOTAL: 1.4 mg/dL — AB (ref 0.2–1.2)
BUN: 13 mg/dL (ref 6–23)
CO2: 32 meq/L (ref 19–32)
Calcium: 9.2 mg/dL (ref 8.4–10.5)
Chloride: 103 mEq/L (ref 96–112)
Creatinine, Ser: 1 mg/dL (ref 0.4–1.5)
GFR: 79.93 mL/min (ref >60.00)
GLUCOSE: 86 mg/dL (ref 70–99)
POTASSIUM: 4.7 meq/L (ref 3.5–5.1)
Sodium: 140 mEq/L (ref 135–145)
Total Protein: 7.1 g/dL (ref 6.0–8.3)

## 2014-08-24 LAB — TSH: TSH: 1.52 u[IU]/mL (ref 0.35–4.50)

## 2014-09-07 ENCOUNTER — Encounter: Payer: BC Managed Care – PPO | Admitting: Family Medicine

## 2014-09-18 ENCOUNTER — Other Ambulatory Visit: Payer: Self-pay | Admitting: Family Medicine

## 2014-09-18 NOTE — Telephone Encounter (Signed)
Received refill request electronically from pharmacy. Last refill 03/07/14 #30/5 refills, last office visit 07/28/13. Patient has an appointment scheduled 09/21/14. Is it okay to refill medication?

## 2014-09-18 NOTE — Telephone Encounter (Signed)
Please call in.  Thanks.   

## 2014-09-19 ENCOUNTER — Other Ambulatory Visit: Payer: Self-pay | Admitting: Family Medicine

## 2014-09-19 NOTE — Telephone Encounter (Signed)
Rx called to pharmacy as instructed. 

## 2014-09-21 ENCOUNTER — Ambulatory Visit (INDEPENDENT_AMBULATORY_CARE_PROVIDER_SITE_OTHER): Payer: BC Managed Care – PPO | Admitting: Family Medicine

## 2014-09-21 ENCOUNTER — Encounter: Payer: Self-pay | Admitting: Family Medicine

## 2014-09-21 VITALS — BP 120/70 | HR 83 | Temp 98.2°F | Ht 68.0 in | Wt 196.0 lb

## 2014-09-21 DIAGNOSIS — E785 Hyperlipidemia, unspecified: Secondary | ICD-10-CM

## 2014-09-21 DIAGNOSIS — D1809 Hemangioma of other sites: Secondary | ICD-10-CM

## 2014-09-21 DIAGNOSIS — F418 Other specified anxiety disorders: Secondary | ICD-10-CM

## 2014-09-21 DIAGNOSIS — F329 Major depressive disorder, single episode, unspecified: Secondary | ICD-10-CM

## 2014-09-21 DIAGNOSIS — F419 Anxiety disorder, unspecified: Secondary | ICD-10-CM

## 2014-09-21 DIAGNOSIS — Z0000 Encounter for general adult medical examination without abnormal findings: Secondary | ICD-10-CM

## 2014-09-21 DIAGNOSIS — I10 Essential (primary) hypertension: Secondary | ICD-10-CM

## 2014-09-21 DIAGNOSIS — Z7189 Other specified counseling: Secondary | ICD-10-CM

## 2014-09-21 DIAGNOSIS — R1012 Left upper quadrant pain: Secondary | ICD-10-CM

## 2014-09-21 DIAGNOSIS — E039 Hypothyroidism, unspecified: Secondary | ICD-10-CM

## 2014-09-21 MED ORDER — DICLOFENAC SODIUM 75 MG PO TBEC: DELAYED_RELEASE_TABLET | ORAL | Status: DC

## 2014-09-21 MED ORDER — ALPRAZOLAM 0.5 MG PO TABS: ORAL_TABLET | ORAL | Status: DC

## 2014-09-21 MED ORDER — CITALOPRAM HYDROBROMIDE 20 MG PO TABS: ORAL_TABLET | ORAL | Status: DC

## 2014-09-21 MED ORDER — VALSARTAN 80 MG PO TABS: ORAL_TABLET | ORAL | Status: DC

## 2014-09-21 MED ORDER — LEVOTHYROXINE SODIUM 112 MCG PO TABS: ORAL_TABLET | ORAL | Status: DC

## 2014-09-21 MED ORDER — ZOLPIDEM TARTRATE 10 MG PO TABS: ORAL_TABLET | ORAL | Status: DC

## 2014-09-21 MED ORDER — CYCLOBENZAPRINE HCL 10 MG PO TABS: 5.0000 mg | ORAL_TABLET | Freq: Three times a day (TID) | ORAL | Status: DC | PRN

## 2014-09-21 MED ORDER — SIMVASTATIN 20 MG PO TABS: ORAL_TABLET | ORAL | Status: DC

## 2014-09-21 NOTE — Patient Instructions (Signed)
Try the flexeril on the outside chance it helps the abdominal pain.  It can make you drowsy, so start with a 1/2 tab.  I'll check on the follow up MRI.  Take care.  Glad to see you.

## 2014-09-21 NOTE — Progress Notes (Signed)
Pre visit review using our clinic review tool, if applicable. No additional management support is needed unless otherwise documented below in the visit note.  CPE- See plan.  Routine anticipatory guidance given to patient.  See health maintenance. Tetanus 2013 PNA at 66 Shingles shot at 61 Flu shot done at work ~3-4 weeks ago.   Colonoscopy prev done.  PSA wnl.   Living will d/w pt.  Would have his sister Alan Ripper designated if patient were incapacitated.   Diet and exercise d/w pt.  Limited by work, d/w pt.  Encouraged.  Hypertension:    Using medication without problems or lightheadedness: yes Chest pain with exertion:no Edema:no Short of breath:no  Elevated Cholesterol: Using medications without problems:yes Muscle aches: no Diet compliance: see above, fair per patient Exercise: limited.   LUQ pain, due for f/u MRI.  Has seen GI, ortho, cancer clinic.  No sig abnormality to cause sx.  He does have vertebral hemangiomas, will be due for f/u MR.  Still with LUQ pain intermittently w/o known cause.    Work has been tough, discussed.    PMH and SH reviewed  Meds, vitals, and allergies reviewed.   ROS: See HPI.  Otherwise negative.    GEN: nad, alert and oriented HEENT: mucous membranes moist NECK: supple w/o LA CV: rrr. PULM: ctab, no inc wob ABD: soft, +bs, LUQ minimally ttp w/o rebound, no rash, no bruising.  EXT: no edema SKIN: no acute rash

## 2014-09-22 ENCOUNTER — Telehealth: Payer: Self-pay | Admitting: Family Medicine

## 2014-09-22 DIAGNOSIS — D1809 Hemangioma of other sites: Secondary | ICD-10-CM | POA: Insufficient documentation

## 2014-09-22 DIAGNOSIS — Z7189 Other specified counseling: Secondary | ICD-10-CM | POA: Insufficient documentation

## 2014-09-22 NOTE — Telephone Encounter (Signed)
Call pt, due for f/u MRI.  Order is in.  Thanks.

## 2014-09-22 NOTE — Telephone Encounter (Signed)
emmi emailed °

## 2014-09-22 NOTE — Assessment & Plan Note (Signed)
Routine anticipatory guidance given to patient.  See health maintenance. Tetanus 2013 PNA at 66 Shingles shot at 61 Flu shot done at work ~3-4 weeks ago.   Colonoscopy prev done.  PSA wnl.   Living will d/w pt.  Would have his sister Alan Ripper designated if patient were incapacitated.   Diet and exercise d/w pt.  Limited by work, d/w pt.  Encouraged.

## 2014-09-22 NOTE — Assessment & Plan Note (Signed)
Asymptomatic, will arrange for f/u MRI.

## 2014-09-22 NOTE — Assessment & Plan Note (Signed)
Controlled, continue current meds.  >25 minutes spent in face to face time with patient, >50% spent in counselling or coordination of care in addition to the time dedicated to the CPE.

## 2014-09-22 NOTE — Assessment & Plan Note (Signed)
tsh wnl, continue as is. D/w pt.  No tmg on exam.  

## 2014-09-22 NOTE — Telephone Encounter (Signed)
Left detailed message on voicemail.  

## 2014-09-22 NOTE — Assessment & Plan Note (Signed)
D/w pt. Continue current meds.  Okay for outpatient f/u.  No ade on meds.

## 2014-09-22 NOTE — Assessment & Plan Note (Signed)
Still w/o clear cause. D/w pt in detail.  Can try flexeril with sedation caution, just to see if this has an effect.  No structural issues seen in imaging, my concern is for function issues, ie muscles spasm vs colonic spasm.  D/w pt.  He'll try flexeril and update me as needed.

## 2014-09-22 NOTE — Assessment & Plan Note (Signed)
D/w pt about labs, continue current meds, will try to work on diet and exercise.

## 2014-09-30 ENCOUNTER — Ambulatory Visit
Admission: RE | Admit: 2014-09-30 | Discharge: 2014-09-30 | Disposition: A | Discharge status: Home / Self Care | Payer: BC Managed Care – PPO | Source: Ambulatory Visit | Attending: Family Medicine | Admitting: Family Medicine

## 2014-09-30 DIAGNOSIS — D1809 Hemangioma of other sites: Secondary | ICD-10-CM

## 2014-11-21 ENCOUNTER — Encounter: Payer: Self-pay | Admitting: Family Medicine

## 2014-11-21 ENCOUNTER — Ambulatory Visit (INDEPENDENT_AMBULATORY_CARE_PROVIDER_SITE_OTHER): Payer: BLUE CROSS/BLUE SHIELD | Admitting: Family Medicine

## 2014-11-21 VITALS — BP 122/80 | HR 81 | Temp 97.5°F | Wt 200.2 lb

## 2014-11-21 DIAGNOSIS — J069 Acute upper respiratory infection, unspecified: Secondary | ICD-10-CM

## 2014-11-21 DIAGNOSIS — J029 Acute pharyngitis, unspecified: Secondary | ICD-10-CM

## 2014-11-21 LAB — POCT RAPID STREP A (OFFICE): Rapid Strep A Screen: NEGATIVE

## 2014-11-21 MED ORDER — LIDOCAINE VISCOUS 2 % MT SOLN: 10.0000 mL | OROMUCOSAL | Status: DC | PRN

## 2014-11-21 NOTE — Patient Instructions (Signed)
Try OTC flonase.  Use 1 spray in each nostril once a day.  That may help.  Drink plenty of fluids and use the lidocaine if needed.  Take care.  Glad to see you.

## 2014-11-21 NOTE — Progress Notes (Signed)
Pre visit review using our clinic review tool, if applicable. No additional management support is needed unless otherwise documented below in the visit note.  duration of symptoms: for about 1 week, sometimes worse than others.  Rhinorrhea: no anterior sx but frequent post nasal gtt and throat clearing Congestion: no ear pain: L ear pain prev, not now sore throat: yes Cough: no Myalgias: no other concerns: no fevers.  No rash, no vomiting, no new abd pain.  Sick contacts noted/possible.    ROS: See HPI.  Otherwise negative.    Meds, vitals, and allergies reviewed.   GEN: nad, alert and oriented HEENT: mucous membranes moist, TM w/o erythema but chronic changes noted, nasal epithelium injected, OP with cobblestoning, no exudates.   NECK: supple w/o LA CV: rrr. PULM: ctab, no inc wob  RST neg.

## 2014-11-22 DIAGNOSIS — J069 Acute upper respiratory infection, unspecified: Secondary | ICD-10-CM | POA: Insufficient documentation

## 2014-11-22 NOTE — Assessment & Plan Note (Signed)
Likely viral, nontoxic, post nasal gtt likely causing his sx. rst neg.  Use flonase for now, should improve. Use lidocaine prn for ST.

## 2014-12-14 ENCOUNTER — Other Ambulatory Visit: Payer: Self-pay | Admitting: Family Medicine

## 2015-01-31 ENCOUNTER — Other Ambulatory Visit: Payer: Self-pay | Admitting: Family Medicine

## 2015-04-04 ENCOUNTER — Other Ambulatory Visit: Payer: Self-pay | Admitting: Family Medicine

## 2015-04-05 NOTE — Telephone Encounter (Signed)
Electronic refill request. Last Filled:    30 tablet 5 RF on 09/21/2014  Please advise.

## 2015-04-05 NOTE — Telephone Encounter (Signed)
Please call in.  Thanks.   

## 2015-04-06 NOTE — Telephone Encounter (Signed)
Rx called in to pharmacy. 

## 2015-07-24 ENCOUNTER — Encounter: Payer: Self-pay | Admitting: Family Medicine

## 2015-07-24 ENCOUNTER — Ambulatory Visit (INDEPENDENT_AMBULATORY_CARE_PROVIDER_SITE_OTHER): Payer: BLUE CROSS/BLUE SHIELD | Admitting: Family Medicine

## 2015-07-24 VITALS — BP 118/70 | HR 84 | Temp 98.8°F | Wt 199.8 lb

## 2015-07-24 DIAGNOSIS — R1012 Left upper quadrant pain: Secondary | ICD-10-CM

## 2015-07-24 MED ORDER — DICYCLOMINE HCL 10 MG PO CAPS
10.0000 mg | ORAL_CAPSULE | Freq: Three times a day (TID) | ORAL | Status: DC
Start: 2015-07-24 — End: 2015-12-21

## 2015-07-24 NOTE — Patient Instructions (Signed)
Start with 1 bentyl a day.  You can gradually increase up to 4 a day, if tolerated and if needed.  Update me in about 1-2 weeks.  Take care.  Glad to see you.

## 2015-07-24 NOTE — Progress Notes (Signed)
Pre visit review using our clinic review tool, if applicable. No additional management support is needed unless otherwise documented below in the visit note.  He continues to have L sided trunk pains. He was picking something up a few weeks ago and felt pain from the groin up into the L pec region.  Still comes and goes.  Irregular onset.  No clear trigger o/w.  No rash.  No bruise.  This is similar to the prev pain but the region appears to be expanded slightly, ie L groin and L pec.  No R sided pain.  He has noted that he has been belching more but that doesn't affect the pain.  Flexeril didn't help.  He tried going on statin prev w/o help. The pain is a dull ache.  He is still on his baseline meds o/w.  He doesn't have exertional chest pain o/w.  The pain can happen at rest.  D/w pt about trial of another PPI (no help with PPI prev) vs trial of bentyl.  No bowel changes.  No blood in stool.    Meds, vitals, and allergies reviewed.   ROS: See HPI.  Otherwise, noncontributory.  nad ncat Neck supple, no LA rrr ctab abd soft Initially with LLQ ttp but recheck w/o any abd tenderness.  No rebound. Normal BS LUQ not ttp Skin w/o rash Ext w/o edema

## 2015-07-24 NOTE — Assessment & Plan Note (Signed)
Still w/o clear source.  This doesn't match any radicular distribution.  I don't feel a hernia. He has been imaged prev w/o any likely contributing findings.  I have no reason to suspect a cardiac source, given that he can have sx at rest but with with exertion.  The question remains about a GI source- he failed PPI tx prev, unclear if empiric tx with bentyl will have any effect re: possible colonic spasm.  Is likely worth a try, d/w pt, he agrees.  Will start with once daily and uptitrate as needed/tolerated, can update me as needed.  Okay for outpatient f/u.  D/w pt that given his timeline and absence of "B" sx, then this is likely not an ominous dx.  He agrees. >25 minutes spent in face to face time with patient, >50% spent in counselling or coordination of care.

## 2015-09-29 ENCOUNTER — Other Ambulatory Visit: Payer: Self-pay | Admitting: Family Medicine

## 2015-10-09 ENCOUNTER — Telehealth: Payer: Self-pay | Admitting: Family Medicine

## 2015-10-09 NOTE — Telephone Encounter (Signed)
Electronic refill request. Last Filled:    30 tablet 5 04/05/2015  Last CPE:   09/21/14

## 2015-10-10 NOTE — Telephone Encounter (Signed)
Please call in.  Thanks.  Due for CPE. Please schedule.

## 2015-10-10 NOTE — Telephone Encounter (Signed)
Lab 1/30 cpx 2/3 Pt aware please close

## 2015-10-10 NOTE — Telephone Encounter (Signed)
Rx called to pharmacy as instructed. Please call and schedule appointment as instructed. 

## 2015-10-29 ENCOUNTER — Other Ambulatory Visit: Payer: Self-pay | Admitting: Family Medicine

## 2015-10-30 ENCOUNTER — Other Ambulatory Visit: Payer: Self-pay | Admitting: Family Medicine

## 2015-11-21 ENCOUNTER — Other Ambulatory Visit: Payer: Self-pay | Admitting: *Deleted

## 2015-11-21 MED ORDER — CITALOPRAM HYDROBROMIDE 20 MG PO TABS: 20.0000 mg | ORAL_TABLET | Freq: Every day | ORAL | Status: DC

## 2015-12-02 ENCOUNTER — Other Ambulatory Visit: Payer: Self-pay | Admitting: Family Medicine

## 2015-12-15 ENCOUNTER — Other Ambulatory Visit: Payer: Self-pay | Admitting: Family Medicine

## 2015-12-16 ENCOUNTER — Other Ambulatory Visit: Payer: Self-pay | Admitting: Family Medicine

## 2015-12-16 DIAGNOSIS — I10 Essential (primary) hypertension: Secondary | ICD-10-CM

## 2015-12-17 ENCOUNTER — Encounter: Payer: Self-pay | Admitting: Radiology

## 2015-12-17 ENCOUNTER — Other Ambulatory Visit (INDEPENDENT_AMBULATORY_CARE_PROVIDER_SITE_OTHER): Payer: BLUE CROSS/BLUE SHIELD

## 2015-12-17 DIAGNOSIS — Z125 Encounter for screening for malignant neoplasm of prostate: Secondary | ICD-10-CM | POA: Diagnosis not present

## 2015-12-17 DIAGNOSIS — I10 Essential (primary) hypertension: Secondary | ICD-10-CM | POA: Diagnosis not present

## 2015-12-17 LAB — TSH: TSH: 0.75 u[IU]/mL (ref 0.35–4.50)

## 2015-12-17 LAB — COMPREHENSIVE METABOLIC PANEL
ALBUMIN: 4.1 g/dL (ref 3.5–5.2)
ALK PHOS: 60 U/L (ref 39–117)
ALT: 47 U/L (ref 0–53)
AST: 22 U/L (ref 0–37)
BILIRUBIN TOTAL: 1 mg/dL (ref 0.2–1.2)
BUN: 16 mg/dL (ref 6–23)
CO2: 31 mEq/L (ref 19–32)
Calcium: 9.3 mg/dL (ref 8.4–10.5)
Chloride: 103 mEq/L (ref 96–112)
Creatinine, Ser: 1.02 mg/dL (ref 0.40–1.50)
GFR: 79.57 mL/min (ref >60.00)
Glucose, Bld: 97 mg/dL (ref 70–99)
POTASSIUM: 4.2 meq/L (ref 3.5–5.1)
Sodium: 141 mEq/L (ref 135–145)
Total Protein: 6.7 g/dL (ref 6.0–8.3)

## 2015-12-17 LAB — LIPID PANEL
CHOLESTEROL: 163 mg/dL (ref 0–200)
HDL: 38.8 mg/dL — ABNORMAL LOW (ref >39.00)
LDL Cholesterol: 101 mg/dL — ABNORMAL HIGH (ref 0–99)
NONHDL: 124.11
Total CHOL/HDL Ratio: 4
Triglycerides: 118 mg/dL (ref 0.0–149.0)
VLDL: 23.6 mg/dL (ref 0.0–40.0)

## 2015-12-17 LAB — PSA: PSA: 1.89 ng/mL (ref 0.10–4.00)

## 2015-12-21 ENCOUNTER — Ambulatory Visit (INDEPENDENT_AMBULATORY_CARE_PROVIDER_SITE_OTHER): Payer: BLUE CROSS/BLUE SHIELD | Admitting: Family Medicine

## 2015-12-21 ENCOUNTER — Encounter: Payer: Self-pay | Admitting: Family Medicine

## 2015-12-21 VITALS — BP 120/80 | HR 96 | Temp 98.6°F | Ht 68.0 in | Wt 194.5 lb

## 2015-12-21 DIAGNOSIS — F329 Major depressive disorder, single episode, unspecified: Secondary | ICD-10-CM

## 2015-12-21 DIAGNOSIS — Z119 Encounter for screening for infectious and parasitic diseases, unspecified: Secondary | ICD-10-CM

## 2015-12-21 DIAGNOSIS — E039 Hypothyroidism, unspecified: Secondary | ICD-10-CM

## 2015-12-21 DIAGNOSIS — E785 Hyperlipidemia, unspecified: Secondary | ICD-10-CM

## 2015-12-21 DIAGNOSIS — G4700 Insomnia, unspecified: Secondary | ICD-10-CM

## 2015-12-21 DIAGNOSIS — R1012 Left upper quadrant pain: Secondary | ICD-10-CM

## 2015-12-21 DIAGNOSIS — Z0000 Encounter for general adult medical examination without abnormal findings: Secondary | ICD-10-CM

## 2015-12-21 DIAGNOSIS — F419 Anxiety disorder, unspecified: Secondary | ICD-10-CM

## 2015-12-21 DIAGNOSIS — I10 Essential (primary) hypertension: Secondary | ICD-10-CM

## 2015-12-21 MED ORDER — DICLOFENAC SODIUM 75 MG PO TBEC: DELAYED_RELEASE_TABLET | ORAL | Status: DC

## 2015-12-21 MED ORDER — DICYCLOMINE HCL 10 MG PO CAPS: 10.0000 mg | ORAL_CAPSULE | Freq: Three times a day (TID) | ORAL | Status: DC

## 2015-12-21 MED ORDER — CITALOPRAM HYDROBROMIDE 20 MG PO TABS: 20.0000 mg | ORAL_TABLET | Freq: Every day | ORAL | Status: DC

## 2015-12-21 MED ORDER — LEVOTHYROXINE SODIUM 112 MCG PO TABS: ORAL_TABLET | ORAL | Status: DC

## 2015-12-21 MED ORDER — SIMVASTATIN 20 MG PO TABS: 20.0000 mg | ORAL_TABLET | Freq: Every day | ORAL | Status: DC

## 2015-12-21 MED ORDER — ZOLPIDEM TARTRATE 10 MG PO TABS: 10.0000 mg | ORAL_TABLET | Freq: Every evening | ORAL | Status: DC | PRN

## 2015-12-21 MED ORDER — VALSARTAN 80 MG PO TABS: 80.0000 mg | ORAL_TABLET | Freq: Every day | ORAL | Status: DC

## 2015-12-21 MED ORDER — ALPRAZOLAM 0.5 MG PO TABS: ORAL_TABLET | ORAL | Status: DC

## 2015-12-21 NOTE — Progress Notes (Signed)
Pre visit review using our clinic review tool, if applicable. No additional management support is needed unless otherwise documented below in the visit note.  CPE- See plan.  Routine anticipatory guidance given to patient.  See health maintenance. Tetanus 2013 PNA due at 66 Shingles shot at 61 Flu shot done at work fall 2016 ~08/2015 Colonoscopy prev done 2009 PSA wnl.  Living will d/w pt. Would have his sister Alan Ripper designated if patient were incapacitated.  Diet and exercise d/w pt. Limited by work, d/w pt. Encouraged. He is trying to get around his work schedule.   Pt opts in for HCV and HIV screening.  D/w pt re: routine screening.    Mood and sleep d/w pt.   Some days and nights are better than others.  Still with some insomnia several times a week.  The meds help, no ADE on meds.  He is able to relax with the meds.    Still with some LUQ pain episodically.  Seemed to be some better on bentyl.  Off med now.  Irregular pattern of sx.  Reasonable to retry bentyl.   D/w pt about prev w/u and that we still don't have a clear dx but the response to bentyl would suggest colonic spasm or similar.  He agrees.    Hypertension:    Using medication without problems or lightheadedness:yes  Chest pain with exertion:no Edema:no Short of breath:no  Hypothyroid.  No ADE on med.  No sx.  Compliant.   Elevated Cholesterol: Using medications without problems:yes  Muscle aches:  No  Diet compliance: encouraged.  Exercise: encouraged.   PMH and SH reviewed  Meds, vitals, and allergies reviewed.   ROS: See HPI.  Otherwise negative.    GEN: nad, alert and oriented, speech and affect wnl.  HEENT: mucous membranes moist NECK: supple w/o LA CV: rrr. PULM: ctab, no inc wob ABD: soft, +bs EXT: no edema SKIN: no acute rash

## 2015-12-21 NOTE — Patient Instructions (Signed)
Take care. Glad to see you.  Try to get a little more exercise.  Don't change your meds.

## 2015-12-23 NOTE — Assessment & Plan Note (Signed)
Continue current med.  He agrees.   Labs d/w pt.

## 2015-12-23 NOTE — Assessment & Plan Note (Signed)
Continue current med.  He agrees.

## 2015-12-23 NOTE — Assessment & Plan Note (Signed)
Continue current med.  He agrees.   D/w pt about diet and exercise.

## 2015-12-23 NOTE — Assessment & Plan Note (Signed)
Seemed to be some better on bentyl. Off med now. Irregular pattern of sx. Reasonable to retry bentyl. D/w pt about prev w/u and that we still don't have a clear dx but the response to bentyl would suggest colonic spasm or similar. He agrees.  Restart bentyl, update me as needed.

## 2015-12-23 NOTE — Assessment & Plan Note (Signed)
Tetanus 2013 PNA due at 66 Shingles shot at 61 Flu shot done at work fall 2016 ~08/2015 Colonoscopy prev done 2009 PSA wnl.  Living will d/w pt. Would have his sister Alan Ripper designated if patient were incapacitated.  Diet and exercise d/w pt. Limited by work, d/w pt. Encouraged. He is trying to get around his work schedule.   Pt opts in for HCV and HIV screening.  D/w pt re: routine screening.

## 2016-01-11 ENCOUNTER — Encounter: Payer: Self-pay | Admitting: Family Medicine

## 2016-03-17 ENCOUNTER — Encounter: Payer: Self-pay | Admitting: Primary Care

## 2016-03-17 ENCOUNTER — Ambulatory Visit (INDEPENDENT_AMBULATORY_CARE_PROVIDER_SITE_OTHER): Payer: BLUE CROSS/BLUE SHIELD | Admitting: Primary Care

## 2016-03-17 VITALS — BP 124/78 | HR 71 | Temp 98.0°F | Ht 68.0 in | Wt 190.0 lb

## 2016-03-17 DIAGNOSIS — J029 Acute pharyngitis, unspecified: Secondary | ICD-10-CM | POA: Diagnosis not present

## 2016-03-17 DIAGNOSIS — H9201 Otalgia, right ear: Secondary | ICD-10-CM

## 2016-03-17 NOTE — Progress Notes (Signed)
Pre visit review using our clinic review tool, if applicable. No additional management support is needed unless otherwise documented below in the visit note. 

## 2016-03-17 NOTE — Patient Instructions (Signed)
There is no evidence of infection to your throat or right ear. I did notice some fluid which can cause discomfort and a reduction in hearing.  Try using Flonase (fluticasone) nasal spray. Instill 2 sprays in each nostril once daily.   Start Claritin antihistamine which can help to dry the ears.  Increase ibuprofen to 600 mg three times daily as needed.  Call me if you develop fevers of 101 or if no improvement in 1 week.  It was a pleasure meeting you!

## 2016-03-17 NOTE — Progress Notes (Signed)
Subjective:    Patient ID: Micheal Jones, male    DOB: 1956-10-13, 60 y.o.   MRN: [id number]  HPI  Micheal Jones is a 60 year old male who presents today with a chief complaint of sore throat. He also reports right sided ear pain. His symptoms have been present for the past 4 days. Denies fevers, cough, congestion, seasonal allergies. He's taken ibuprofen without much improvement in pain. He's been around his daughter who has been complaining of similar symptoms.   Review of Systems  Constitutional: Negative for fever and chills.  HENT: Negative for congestion.   Respiratory: Negative for cough and shortness of breath.   Cardiovascular: Negative for chest pain.  Musculoskeletal: Negative for myalgias.       Past Medical History  Diagnosis Date  . Depression   . Hyperlipidemia   . Hypertension   . Hypothyroidism   . Insomnia   . GERD (gastroesophageal reflux disease)   . Anxiety   . Renal stones   . Achilles tendon tear     right, partial, no surgery  . Arthritis     C and L spine  . Sleep apnea     occ use of CPAP, trouble with mask fit     Social History   Social History  . Marital Status: Single    Spouse Name: N/A  . Number of Children: 1  . Years of Education: N/A   Occupational History  . SYSTEM ANALYST [employer]   Social History Main Topics  . Smoking status: Never Smoker   . Smokeless tobacco: Never Used  . Alcohol Use: 0.0 oz/week    0 Standard drinks or equivalent per week     Comment: occ- 3-4 per week  . Drug Use: No  . Sexual Activity: Not on file   Other Topics Concern  . Not on file   Social History Narrative   [employer]- system programming- works in [place]   Divorced 2009   1 daughter, split custody    Past Surgical History  Procedure Laterality Date  . Tonsillectomy    . Thyroidectomy      partia  . Lithotripsy      x 2    Family History  Problem Relation Age of Onset  . Stroke Mother   . Coronary artery disease Mother   . Coronary  artery disease Brother   . Suicidality Father   . Colon cancer Neg Hx   . Prostate cancer Neg Hx     No Known Allergies  Current Outpatient Prescriptions on File Prior to Visit  Medication Sig Dispense Refill  . ALPRAZolam (XANAX) 0.5 MG tablet TAKE 1 TABLET IN THE MORNING, MIDAFTERNOON, AND EVERY NIGHT 90 tablet 5  . aspirin 81 MG tablet Take 81 mg by mouth daily.    . citalopram (CELEXA) 20 MG tablet Take 1 tablet (20 mg total) by mouth daily. 90 tablet 3  . diclofenac (VOLTAREN) 75 MG EC tablet TAKE 1 TABLET BY MOUTH TWICE A DAY WITH FOOD 180 tablet 3  . dicyclomine (BENTYL) 10 MG capsule Take 1 capsule (10 mg total) by mouth 4 (four) times daily -  before meals and at bedtime. 120 capsule 12  . levothyroxine (SYNTHROID, LEVOTHROID) 112 MCG tablet TAKE 1 TABLET BY MOUTH BEFORE BREAKFAST 90 tablet 3  . simvastatin (ZOCOR) 20 MG tablet Take 1 tablet (20 mg total) by mouth at bedtime. 90 tablet 3  . valsartan (DIOVAN) 80 MG tablet Take 1 tablet (80  mg total) by mouth daily. 90 tablet 3  . zolpidem (AMBIEN) 10 MG tablet Take 1 tablet (10 mg total) by mouth at bedtime as needed. 90 tablet 1  . nitroGLYCERIN (NITROSTAT) 0.4 MG SL tablet Place 1 tablet (0.4 mg total) under the tongue every 5 (five) minutes as needed for chest pain. 25 tablet 3   No current facility-administered medications on file prior to visit.    BP 124/78 mmHg  Pulse 71  Temp(Src) 98 F (36.7 C) (Oral)  Ht 5\' 8"  (1.727 m)  Wt 190 lb (86.183 kg)  BMI 28.90 kg/m2  SpO2 98%    Objective:   Physical Exam  Constitutional: He appears well-nourished.  HENT:  Right Ear: Ear canal normal. A middle ear effusion is present.  Left Ear: Tympanic membrane and ear canal normal.  Nose: Nose normal. No mucosal edema. Right sinus exhibits no maxillary sinus tenderness and no frontal sinus tenderness. Left sinus exhibits no maxillary sinus tenderness and no frontal sinus tenderness.  Mouth/Throat: Oropharynx is clear and  moist.  Scar tissue noted to left TM. No s/s of infection bilaterally  Eyes: Conjunctivae are normal.  Neck: Neck supple.  Cardiovascular: Normal rate and regular rhythm.   Pulmonary/Chest: Effort normal and breath sounds normal. He has no wheezes. He has no rales.  Skin: Skin is warm and dry.          Assessment & Plan:  Sore Throat and Ear Pain:  Present for 4 days. No other symptoms, cough, congestion, fevers, chills. Minimal improvement with ibuprofen. Exam without evidence of bacterial infection to pharynx or TM's bilaterally. Mild effusion noted to right TM, scar tissue to left TM.  Lungs clear. Will have him use Flonase, Claritin, and ibuprofen OTC for treatment. Return precautions provided.

## 2016-03-20 ENCOUNTER — Other Ambulatory Visit: Payer: Self-pay | Admitting: Family Medicine

## 2016-06-23 ENCOUNTER — Other Ambulatory Visit: Payer: Self-pay | Admitting: Family Medicine

## 2016-08-18 ENCOUNTER — Other Ambulatory Visit: Payer: Self-pay | Admitting: Family Medicine

## 2016-08-18 NOTE — Telephone Encounter (Signed)
Received electronically Last refill 12/21/15 #90/1 Last office visit 03/17/16/acute

## 2016-08-19 NOTE — Telephone Encounter (Signed)
Please call in.  Thanks.   

## 2016-08-19 NOTE — Telephone Encounter (Signed)
Medication phoned to pharmacy.  

## 2017-01-05 ENCOUNTER — Other Ambulatory Visit: Payer: Self-pay | Admitting: Family Medicine

## 2017-01-16 ENCOUNTER — Other Ambulatory Visit: Payer: Self-pay | Admitting: Family Medicine

## 2017-01-16 NOTE — Telephone Encounter (Signed)
Rx Levothyroxine 112 MCG #30 with 0 Refills sent to pharmacy Last OV for this medication was 12/21/15 Last TSH 12/17/2015 Patient needs OV for further refills

## 2017-02-22 ENCOUNTER — Other Ambulatory Visit: Payer: Self-pay | Admitting: Family Medicine

## 2017-04-19 ENCOUNTER — Other Ambulatory Visit: Payer: Self-pay | Admitting: Family Medicine

## 2017-04-20 NOTE — Telephone Encounter (Signed)
Okay to fill rx for 90d if he'll schedule CPE with labs ahead of time.  Thanks.

## 2017-04-20 NOTE — Telephone Encounter (Signed)
Received refill electronically Last refill 03/04/17 #15 Last office visit 03/17/16/acute Last refill note sent to patient to schedule office visit, no upcoming appointment scheduled

## 2017-04-21 NOTE — Telephone Encounter (Signed)
Left detailed message on voicemail, awaiting notification that a CPE with labs prior has been scheduled.

## 2017-04-23 ENCOUNTER — Telehealth: Payer: Self-pay | Admitting: Family Medicine

## 2017-04-23 NOTE — Telephone Encounter (Signed)
Okay to fill rx for 90d if he'll schedule CPE with labs ahead of time.  Thanks.

## 2017-04-30 NOTE — Telephone Encounter (Signed)
Patient called and scheduled physical on 05/22/17 and labs the week before.  Please refill medication.

## 2017-04-30 NOTE — Telephone Encounter (Signed)
Left detailed message on voicemail asking which meds he needs refilled.

## 2017-05-01 ENCOUNTER — Other Ambulatory Visit: Payer: Self-pay | Admitting: Family Medicine

## 2017-05-01 NOTE — Telephone Encounter (Signed)
Meds refilled.

## 2017-05-03 ENCOUNTER — Other Ambulatory Visit: Payer: Self-pay | Admitting: Family Medicine

## 2017-05-03 ENCOUNTER — Encounter: Payer: Self-pay | Admitting: Family Medicine

## 2017-05-03 DIAGNOSIS — Z125 Encounter for screening for malignant neoplasm of prostate: Secondary | ICD-10-CM

## 2017-05-03 DIAGNOSIS — E039 Hypothyroidism, unspecified: Secondary | ICD-10-CM

## 2017-05-03 DIAGNOSIS — E785 Hyperlipidemia, unspecified: Secondary | ICD-10-CM

## 2017-05-13 ENCOUNTER — Other Ambulatory Visit (INDEPENDENT_AMBULATORY_CARE_PROVIDER_SITE_OTHER): Payer: BLUE CROSS/BLUE SHIELD

## 2017-05-13 ENCOUNTER — Other Ambulatory Visit: Payer: Self-pay | Admitting: Family Medicine

## 2017-05-13 DIAGNOSIS — E785 Hyperlipidemia, unspecified: Secondary | ICD-10-CM | POA: Diagnosis not present

## 2017-05-13 DIAGNOSIS — Z119 Encounter for screening for infectious and parasitic diseases, unspecified: Secondary | ICD-10-CM | POA: Diagnosis not present

## 2017-05-13 DIAGNOSIS — E039 Hypothyroidism, unspecified: Secondary | ICD-10-CM | POA: Diagnosis not present

## 2017-05-13 DIAGNOSIS — Z125 Encounter for screening for malignant neoplasm of prostate: Secondary | ICD-10-CM | POA: Diagnosis not present

## 2017-05-13 LAB — COMPREHENSIVE METABOLIC PANEL
ALBUMIN: 4.1 g/dL (ref 3.5–5.2)
ALT: 35 U/L (ref 0–53)
AST: 22 U/L (ref 0–37)
Alkaline Phosphatase: 63 U/L (ref 39–117)
BILIRUBIN TOTAL: 0.9 mg/dL (ref 0.2–1.2)
BUN: 10 mg/dL (ref 6–23)
CALCIUM: 9.2 mg/dL (ref 8.4–10.5)
CHLORIDE: 104 meq/L (ref 96–112)
CO2: 32 mEq/L (ref 19–32)
CREATININE: 0.99 mg/dL (ref 0.40–1.50)
GFR: 81.96 mL/min (ref >60.00)
Glucose, Bld: 98 mg/dL (ref 70–99)
Potassium: 4.5 mEq/L (ref 3.5–5.1)
SODIUM: 139 meq/L (ref 135–145)
TOTAL PROTEIN: 6.3 g/dL (ref 6.0–8.3)

## 2017-05-13 LAB — LIPID PANEL
CHOL/HDL RATIO: 5
CHOLESTEROL: 223 mg/dL — AB (ref 0–200)
HDL: 42.7 mg/dL (ref >39.00)
LDL CALC: 157 mg/dL — AB (ref 0–99)
NonHDL: 180.63
Triglycerides: 117 mg/dL (ref 0.0–149.0)
VLDL: 23.4 mg/dL (ref 0.0–40.0)

## 2017-05-13 LAB — TSH: TSH: 2.77 u[IU]/mL (ref 0.35–4.50)

## 2017-05-13 LAB — PSA: PSA: 1.44 ng/mL (ref 0.10–4.00)

## 2017-05-14 LAB — HEPATITIS C ANTIBODY: HCV Ab: NEGATIVE

## 2017-05-14 LAB — HIV ANTIBODY (ROUTINE TESTING W REFLEX): HIV: NONREACTIVE

## 2017-05-22 ENCOUNTER — Encounter: Payer: Self-pay | Admitting: Family Medicine

## 2017-05-22 ENCOUNTER — Ambulatory Visit (INDEPENDENT_AMBULATORY_CARE_PROVIDER_SITE_OTHER): Payer: BLUE CROSS/BLUE SHIELD | Admitting: Family Medicine

## 2017-05-22 VITALS — BP 118/72 | HR 81 | Temp 98.5°F | Ht 68.0 in | Wt 197.5 lb

## 2017-05-22 DIAGNOSIS — I10 Essential (primary) hypertension: Secondary | ICD-10-CM

## 2017-05-22 DIAGNOSIS — F329 Major depressive disorder, single episode, unspecified: Secondary | ICD-10-CM

## 2017-05-22 DIAGNOSIS — Z0000 Encounter for general adult medical examination without abnormal findings: Secondary | ICD-10-CM

## 2017-05-22 DIAGNOSIS — R1012 Left upper quadrant pain: Secondary | ICD-10-CM

## 2017-05-22 DIAGNOSIS — E039 Hypothyroidism, unspecified: Secondary | ICD-10-CM

## 2017-05-22 DIAGNOSIS — G4700 Insomnia, unspecified: Secondary | ICD-10-CM

## 2017-05-22 DIAGNOSIS — F419 Anxiety disorder, unspecified: Secondary | ICD-10-CM

## 2017-05-22 DIAGNOSIS — F32A Depression, unspecified: Secondary | ICD-10-CM

## 2017-05-22 DIAGNOSIS — E785 Hyperlipidemia, unspecified: Secondary | ICD-10-CM

## 2017-05-22 MED ORDER — VALSARTAN 80 MG PO TABS: 80.0000 mg | ORAL_TABLET | Freq: Every day | ORAL | 3 refills | Status: DC

## 2017-05-22 MED ORDER — ZOLPIDEM TARTRATE 10 MG PO TABS: 10.0000 mg | ORAL_TABLET | Freq: Every evening | ORAL | 1 refills | Status: DC | PRN

## 2017-05-22 MED ORDER — CITALOPRAM HYDROBROMIDE 20 MG PO TABS: 20.0000 mg | ORAL_TABLET | Freq: Every day | ORAL | 3 refills | Status: DC

## 2017-05-22 MED ORDER — ALPRAZOLAM 0.5 MG PO TABS: 0.5000 mg | ORAL_TABLET | Freq: Every day | ORAL | 1 refills | Status: DC | PRN

## 2017-05-22 MED ORDER — LEVOTHYROXINE SODIUM 112 MCG PO TABS: ORAL_TABLET | ORAL | 3 refills | Status: DC

## 2017-05-22 MED ORDER — DICLOFENAC SODIUM 75 MG PO TBEC: DELAYED_RELEASE_TABLET | ORAL | 3 refills | Status: DC

## 2017-05-22 MED ORDER — SIMVASTATIN 20 MG PO TABS: 20.0000 mg | ORAL_TABLET | Freq: Every day | ORAL | 3 refills | Status: DC

## 2017-05-22 NOTE — Patient Instructions (Addendum)
Check with your insurance to see if they will cover the shingles shot. Use the eat right diet.  I would get a flu shot each fall.   Update me as needed.  Take care.  Glad to see you.

## 2017-05-22 NOTE — Progress Notes (Signed)
CPE- See plan.  Routine anticipatory guidance given to patient.  See health maintenance.  The possibility exists that previously documented standard health maintenance information may have been brought forward from a previous encounter into this note.  If needed, that same information has been updated to reflect the current situation based on today's encounter.    Tetanus 2013 PNA due at 66 Shingles shot d/w pt.   Flu shot done at work Colonoscopy prev done 2009 PSA wnl.  Living will d/w pt. Would have his sister Alan Ripper designated if patient were incapacitated.  Diet and exercise d/w pt. Limited by work, d/w pt. Encouraged. He is trying to get around his work schedule.   HCV and HIV screening neg 2018  Mood d/w pt.  Rare use of BZD, not daily, has PRN use only.   Still on SSRI.    L side pain.  Bentyl didn't help.  Still with episodic abd/L side pain.  Has been going on for years.  Sx are some better after a BM.  He puts up with sx.  Sx are stable, not worsening.  We discussed options at this point. He previously had an extensive workup that did not yield any actionable interventions that led to improvement.  Elevated Cholesterol: Using medications without problems: yes Muscle aches: no Diet compliance:encouraged Exercise: encouraged.  Labs d/w pt.  Had been off med prior to labs but back on med now w/o ADE.    Ambien for insomnia.  No ADE on med.  No parasomnias.   Hypertension:    Using medication without problems or lightheadedness: yes Chest pain with exertion:no Edema:no Short of breath:no  Hypothyroidism.  Compliant.  TSH wnl.  No dysphagia.  No neck mass.    PMH and SH reviewed  Meds, vitals, and allergies reviewed.   ROS: Per HPI.  Unless specifically indicated otherwise in HPI, the patient denies:  General: fever. Eyes: acute vision changes ENT: sore throat Cardiovascular: chest pain Respiratory: SOB GI: vomiting GU: dysuria Musculoskeletal: acute  back pain Derm: acute rash Neuro: acute motor dysfunction Psych: worsening mood Endocrine: polydipsia Heme: bleeding Allergy: hayfever  GEN: nad, alert and oriented HEENT: mucous membranes moist NECK: supple w/o LA CV: rrr. PULM: ctab, no inc wob ABD: soft, +bs EXT: no edema SKIN: no acute rash

## 2017-05-24 NOTE — Assessment & Plan Note (Signed)
Continue Ambien. No adverse effect. Good effect.

## 2017-05-24 NOTE — Assessment & Plan Note (Signed)
Reasonable control. Needs to continue work on diet and exercise. No change in meds. He agrees.

## 2017-05-24 NOTE — Assessment & Plan Note (Signed)
Bentyl seemed not to help. Discussed with him about options. He still could have a functional colonic issue causing some of his symptoms. At this point with stable symptoms and no alarming findings he wanted to put up with his symptoms as is. If he has any alarming or worsening symptoms in the meantime then he can let us know. He agrees with plan. I do not suspect an ominous ongoing pathology that would need intervention at this point otherwise.

## 2017-05-24 NOTE — Assessment & Plan Note (Signed)
Tetanus 2013 PNA due at 66 Shingles shot d/w pt.   Flu shot done at work Colonoscopy prev done 2009 PSA wnl.  Living will d/w pt. Would have his sister Alan Ripper designated if patient were incapacitated.  Diet and exercise d/w pt. Limited by work, d/w pt. Encouraged. He is trying to get around his work schedule.   HCV and HIV screening neg 2018

## 2017-05-24 NOTE — Assessment & Plan Note (Signed)
TSH normal. No thyromegaly. Continue as is.

## 2017-05-24 NOTE — Assessment & Plan Note (Signed)
Continue citalopram. Work stressors noted. Rare use of benzodiazepine no adverse effect on medication. Continue as is. Okay for outpatient follow-up.

## 2017-05-24 NOTE — Assessment & Plan Note (Signed)
No adverse effect on medication. He had been off med prior to lab draw but is back on med now. Would continue on med. Discussed with him about diet and exercise.

## 2018-05-24 ENCOUNTER — Other Ambulatory Visit: Payer: Self-pay | Admitting: Family Medicine

## 2018-05-24 NOTE — Telephone Encounter (Signed)
Ok to refill? Electronically refill request    Last prescribed 05/22/2017  Last seen on 05/22/2017

## 2018-05-25 NOTE — Telephone Encounter (Signed)
Please schedule appointment as instructed. 

## 2018-05-25 NOTE — Telephone Encounter (Signed)
Left message for patient to call back-Micheal Jones, RMA  

## 2018-05-25 NOTE — Telephone Encounter (Signed)
Needs cpe scheduled.  Thanks.  Sent.

## 2018-05-31 NOTE — Telephone Encounter (Signed)
Appointment scheduled by Angelene Giovanni, RMA

## 2018-06-12 ENCOUNTER — Other Ambulatory Visit: Payer: Self-pay | Admitting: Family Medicine

## 2018-06-24 ENCOUNTER — Encounter: Payer: Self-pay | Admitting: Gastroenterology

## 2018-07-05 ENCOUNTER — Other Ambulatory Visit: Payer: Self-pay | Admitting: Family Medicine

## 2018-07-05 DIAGNOSIS — Z125 Encounter for screening for malignant neoplasm of prostate: Secondary | ICD-10-CM

## 2018-07-05 DIAGNOSIS — E039 Hypothyroidism, unspecified: Secondary | ICD-10-CM

## 2018-07-05 DIAGNOSIS — E785 Hyperlipidemia, unspecified: Secondary | ICD-10-CM

## 2018-07-07 ENCOUNTER — Other Ambulatory Visit (INDEPENDENT_AMBULATORY_CARE_PROVIDER_SITE_OTHER): Payer: BLUE CROSS/BLUE SHIELD

## 2018-07-07 DIAGNOSIS — Z125 Encounter for screening for malignant neoplasm of prostate: Secondary | ICD-10-CM | POA: Diagnosis not present

## 2018-07-07 DIAGNOSIS — E039 Hypothyroidism, unspecified: Secondary | ICD-10-CM | POA: Diagnosis not present

## 2018-07-07 DIAGNOSIS — E785 Hyperlipidemia, unspecified: Secondary | ICD-10-CM | POA: Diagnosis not present

## 2018-07-07 LAB — COMPREHENSIVE METABOLIC PANEL
ALT: 39 U/L (ref 0–53)
AST: 23 U/L (ref 0–37)
Albumin: 4.2 g/dL (ref 3.5–5.2)
Alkaline Phosphatase: 66 U/L (ref 39–117)
BUN: 11 mg/dL (ref 6–23)
CHLORIDE: 104 meq/L (ref 96–112)
CO2: 29 meq/L (ref 19–32)
CREATININE: 1.05 mg/dL (ref 0.40–1.50)
Calcium: 9.5 mg/dL (ref 8.4–10.5)
GFR: 76.28 mL/min (ref >60.00)
Glucose, Bld: 104 mg/dL — ABNORMAL HIGH (ref 70–99)
POTASSIUM: 4.6 meq/L (ref 3.5–5.1)
SODIUM: 140 meq/L (ref 135–145)
Total Bilirubin: 1 mg/dL (ref 0.2–1.2)
Total Protein: 6.9 g/dL (ref 6.0–8.3)

## 2018-07-07 LAB — PSA: PSA: 1.9 ng/mL (ref 0.10–4.00)

## 2018-07-07 LAB — TSH: TSH: 3.85 u[IU]/mL (ref 0.35–4.50)

## 2018-07-07 LAB — LIPID PANEL
CHOL/HDL RATIO: 5
Cholesterol: 183 mg/dL (ref 0–200)
HDL: 39.1 mg/dL (ref >39.00)
LDL CALC: 118 mg/dL — AB (ref 0–99)
NonHDL: 144.21
Triglycerides: 133 mg/dL (ref 0.0–149.0)
VLDL: 26.6 mg/dL (ref 0.0–40.0)

## 2018-07-12 ENCOUNTER — Ambulatory Visit (INDEPENDENT_AMBULATORY_CARE_PROVIDER_SITE_OTHER): Payer: BLUE CROSS/BLUE SHIELD | Admitting: Family Medicine

## 2018-07-12 ENCOUNTER — Encounter: Payer: Self-pay | Admitting: Family Medicine

## 2018-07-12 VITALS — BP 122/64 | HR 82 | Temp 98.5°F | Ht 68.0 in | Wt 201.5 lb

## 2018-07-12 DIAGNOSIS — I10 Essential (primary) hypertension: Secondary | ICD-10-CM

## 2018-07-12 DIAGNOSIS — Z0000 Encounter for general adult medical examination without abnormal findings: Secondary | ICD-10-CM | POA: Diagnosis not present

## 2018-07-12 DIAGNOSIS — E039 Hypothyroidism, unspecified: Secondary | ICD-10-CM

## 2018-07-12 DIAGNOSIS — E785 Hyperlipidemia, unspecified: Secondary | ICD-10-CM

## 2018-07-12 DIAGNOSIS — F329 Major depressive disorder, single episode, unspecified: Secondary | ICD-10-CM

## 2018-07-12 DIAGNOSIS — G4700 Insomnia, unspecified: Secondary | ICD-10-CM

## 2018-07-12 DIAGNOSIS — R1012 Left upper quadrant pain: Secondary | ICD-10-CM

## 2018-07-12 DIAGNOSIS — Z7189 Other specified counseling: Secondary | ICD-10-CM

## 2018-07-12 DIAGNOSIS — F419 Anxiety disorder, unspecified: Secondary | ICD-10-CM

## 2018-07-12 MED ORDER — VALSARTAN 80 MG PO TABS: 80.0000 mg | ORAL_TABLET | Freq: Every day | ORAL | 3 refills | Status: DC

## 2018-07-12 MED ORDER — SIMVASTATIN 20 MG PO TABS: 20.0000 mg | ORAL_TABLET | Freq: Every day | ORAL | 3 refills | Status: DC

## 2018-07-12 MED ORDER — DICLOFENAC SODIUM 75 MG PO TBEC: DELAYED_RELEASE_TABLET | ORAL | 3 refills | Status: DC

## 2018-07-12 MED ORDER — CITALOPRAM HYDROBROMIDE 20 MG PO TABS: 20.0000 mg | ORAL_TABLET | Freq: Every day | ORAL | 3 refills | Status: DC

## 2018-07-12 MED ORDER — LEVOTHYROXINE SODIUM 112 MCG PO TABS: 112.0000 ug | ORAL_TABLET | Freq: Every day | ORAL | 3 refills | Status: DC

## 2018-07-12 NOTE — Patient Instructions (Addendum)
You can try tapering citalopram after the job change, ie 1/2 tab a day for 1 month, then stop, if tolerated.  Update me as needed.   I'll await the notes from the GI clinic.   Take care.  Glad to see you.  Update me as needed.

## 2018-07-12 NOTE — Progress Notes (Signed)
CPE- See plan.  Routine anticipatory guidance given to patient.  See health maintenance.  The possibility exists that previously documented standard health maintenance information may have been brought forward from a previous encounter into this note.  If needed, that same information has been updated to reflect the current situation based on today's encounter.    Tetanus 2013 PNA due at 66 Shingles shot d/w pt.   Flu shot done at work Colonoscopy prev done 2009- he'll call about f/u.   PSA wnl.  Living will d/w pt. Would have his sister Alan Ripper designated if patient were incapacitated.  Diet and exercise d/w pt. Encouraged both, trying to work around his job schedule.   HCV and HIV screening neg 2018  L side pain.  Bentyl didn't help.  Still with episodic abd/L side pain but some better than prev.  Has been going on for years.  Sx are some better after a BM.  He puts up with sx.  Sx are not worsening.  He previously had an extensive workup that did not yield any actionable interventions that led to improvement.  If this were coming from his back, and if he can put up with it, then he is likely better avoiding surgery, d/w pt- he agrees.   He is going to change jobs and this is a good change for patient.  He feels like it will be an improvement. He won't have to travel but will commute to [place].  He hopes to work some from home.    Hypothyroidism. No ADE on med.  TSH wnl.  Compliant.  No neck mass.  No dysphagia.    Elevated Cholesterol: Using medications without problems: yes Muscle aches: no Diet compliance: encouraged.  Exercise:encouraged  SSRI.  H/o anxiety and depression after his mother died.  We talked about tapering after his job change, ie 1/2 tab a day for 1 month, then stop, if tolerated.    Hypertension:    Using medication without problems or lightheadedness: yes Chest pain with exertion:no Edema:no Short of breath:no  Insomnia, prn ambien use.  No ADE on med.   +effect.   He has nocturia noted, he can put up with it.     PMH and SH reviewed  Meds, vitals, and allergies reviewed.   ROS: Per HPI.  Unless specifically indicated otherwise in HPI, the patient denies:  General: fever. Eyes: acute vision changes ENT: sore throat Cardiovascular: chest pain Respiratory: SOB GI: vomiting GU: dysuria Musculoskeletal: acute back pain Derm: acute rash Neuro: acute motor dysfunction Psych: worsening mood Endocrine: polydipsia Heme: bleeding Allergy: hayfever  GEN: nad, alert and oriented HEENT: mucous membranes moist NECK: supple w/o LA CV: rrr. PULM: ctab, no inc wob ABD: soft, +bs EXT: no edema SKIN: no acute rash

## 2018-07-13 NOTE — Assessment & Plan Note (Signed)
Needs work on diet and exercise.  Labs discussed with patient.  No change in meds.

## 2018-07-13 NOTE — Assessment & Plan Note (Signed)
He has a job change coming up.  He is going to make that transition and then if his mood is doing well he may taper down slowly on citalopram.  Discussed with patient.  Routine cautions given.  He agrees.  Okay for outpatient follow-up.

## 2018-07-13 NOTE — Assessment & Plan Note (Signed)
Tetanus 2013 PNA due at 66 Shingles shot d/w pt.   Flu shot done at work Colonoscopy prev done 2009- he'll call about f/u.   PSA wnl.  Living will d/w pt. Would have his sister Alan Ripper designated if patient were incapacitated.  Diet and exercise d/w pt. Encouraged both, trying to work around his job schedule.   HCV and HIV screening neg 2018

## 2018-07-13 NOTE — Assessment & Plan Note (Signed)
This is been going on for years with unremarkable work-up so far.  Discussed with patient.  He is basically putting up with intermittent symptoms.  He is not gotten worse in the meantime.  I do not suspect any ominous cause.  He has GI follow-up pending.  I will update them about his symptoms prior to colonoscopy.

## 2018-07-13 NOTE — Assessment & Plan Note (Signed)
Continue statin.  Labs discussed with patient.  Needs work on diet and exercise.  Discussed.

## 2018-07-13 NOTE — Assessment & Plan Note (Signed)
  Living will d/w pt. Would have his sister Alan Ripper designated if patient were incapacitated.

## 2018-07-13 NOTE — Assessment & Plan Note (Signed)
Can use Ambien as needed.  No adverse effect.  Not use nightly.  Update me as needed.  Routine cautions given.

## 2018-07-13 NOTE — Assessment & Plan Note (Signed)
TSH normal.  Compliant with medication.  No thyromegaly.  Update me as needed.  He agrees.

## 2018-08-18 ENCOUNTER — Other Ambulatory Visit: Payer: Self-pay | Admitting: Family Medicine

## 2018-08-18 MED ORDER — NITROGLYCERIN 0.4 MG SL SUBL: 0.4000 mg | SUBLINGUAL_TABLET | SUBLINGUAL | 1 refills | Status: AC | PRN

## 2018-08-18 NOTE — Telephone Encounter (Signed)
Pt last annual exam on 07/12/18. Refill ntg 0.4 mg per protocol. [facility].

## 2018-09-10 DIAGNOSIS — D2262 Melanocytic nevi of left upper limb, including shoulder: Secondary | ICD-10-CM | POA: Diagnosis not present

## 2018-09-10 DIAGNOSIS — L988 Other specified disorders of the skin and subcutaneous tissue: Secondary | ICD-10-CM | POA: Diagnosis not present

## 2018-09-10 DIAGNOSIS — D2261 Melanocytic nevi of right upper limb, including shoulder: Secondary | ICD-10-CM | POA: Diagnosis not present

## 2018-09-10 DIAGNOSIS — L720 Epidermal cyst: Secondary | ICD-10-CM | POA: Diagnosis not present

## 2018-09-10 DIAGNOSIS — D225 Melanocytic nevi of trunk: Secondary | ICD-10-CM | POA: Diagnosis not present

## 2018-09-10 DIAGNOSIS — R208 Other disturbances of skin sensation: Secondary | ICD-10-CM | POA: Diagnosis not present

## 2018-09-10 DIAGNOSIS — D485 Neoplasm of uncertain behavior of skin: Secondary | ICD-10-CM | POA: Diagnosis not present

## 2018-11-19 ENCOUNTER — Encounter: Payer: Self-pay | Admitting: Gastroenterology

## 2018-12-09 ENCOUNTER — Encounter: Payer: Self-pay | Admitting: Gastroenterology

## 2018-12-09 ENCOUNTER — Ambulatory Visit (AMBULATORY_SURGERY_CENTER): Payer: Self-pay

## 2018-12-09 VITALS — Ht 67.0 in | Wt 206.0 lb

## 2018-12-09 DIAGNOSIS — Z1211 Encounter for screening for malignant neoplasm of colon: Secondary | ICD-10-CM

## 2018-12-09 MED ORDER — NA SULFATE-K SULFATE-MG SULF 17.5-3.13-1.6 GM/177ML PO SOLN: 1.0000 | Freq: Once | ORAL | 0 refills | Status: AC

## 2018-12-09 NOTE — Progress Notes (Signed)
Per pt, no allergies to soy or egg products.Pt not taking any weight loss meds or using  O2 at home.  Pt refused emmi video. 

## 2018-12-20 ENCOUNTER — Encounter: Payer: BLUE CROSS/BLUE SHIELD | Admitting: Gastroenterology

## 2018-12-22 ENCOUNTER — Ambulatory Visit (AMBULATORY_SURGERY_CENTER): Payer: 59 | Admitting: Gastroenterology

## 2018-12-22 ENCOUNTER — Encounter: Payer: Self-pay | Admitting: Gastroenterology

## 2018-12-22 VITALS — BP 108/64 | HR 70 | Temp 98.4°F | Resp 11 | Ht 67.0 in | Wt 206.0 lb

## 2018-12-22 DIAGNOSIS — Z1211 Encounter for screening for malignant neoplasm of colon: Secondary | ICD-10-CM | POA: Diagnosis not present

## 2018-12-22 MED ORDER — SODIUM CHLORIDE 0.9 % IV SOLN: 500.0000 mL | INTRAVENOUS | Status: DC

## 2018-12-22 NOTE — Progress Notes (Signed)
Report to PACU, RN, vss, BBS= Clear.  

## 2018-12-22 NOTE — Progress Notes (Signed)
Pt's states no medical or surgical changes since previsit or office visit. 

## 2018-12-22 NOTE — Op Note (Signed)
[facility] Patient Name: Micheal Jones Procedure Date: 12/22/2018 8:58 AM MRN: [id number] Endoscopist: Ladene Artist , MD Age: 63 Referring MD:  Date of Birth: 12-May-1956 Gender: Male Account #: [id number] Procedure:                Colonoscopy Indications:              Screening for colorectal malignant neoplasm Medicines:                Monitored Anesthesia Care Procedure:                Pre-Anesthesia Assessment:                           - Prior to the procedure, a History and Physical                            was performed, and patient medications and                            allergies were reviewed. The patient's tolerance of                            previous anesthesia was also reviewed. The risks                            and benefits of the procedure and the sedation                            options and risks were discussed with the patient.                            All questions were answered, and informed consent                            was obtained. Prior Anticoagulants: The patient has                            taken no previous anticoagulant or antiplatelet                            agents. ASA Grade Assessment: II - A patient with                            mild systemic disease. After reviewing the risks                            and benefits, the patient was deemed in                            satisfactory condition to undergo the procedure.                           After obtaining informed consent, the colonoscope  was passed under direct vision. Throughout the                            procedure, the patient's blood pressure, pulse, and                            oxygen saturations were monitored continuously. The                            Colonoscope was introduced through the anus and                            advanced to the the cecum, identified by                            appendiceal orifice and  ileocecal valve. The                            ileocecal valve, appendiceal orifice, and rectum                            were photographed. The quality of the bowel                            preparation was excellent. The colonoscopy was                            performed without difficulty. The patient tolerated                            the procedure well. Scope In: 9:05:10 AM Scope Out: 9:20:51 AM Scope Withdrawal Time: 0 hours 12 minutes 4 seconds  Total Procedure Duration: 0 hours 15 minutes 41 seconds  Findings:                 The perianal and digital rectal examinations were                            normal.                           Internal hemorrhoids were found during                            retroflexion. The hemorrhoids were small and Grade                            I (internal hemorrhoids that do not prolapse).                           The exam was otherwise without abnormality on                            direct and retroflexion views. Complications:            No immediate complications. Estimated blood loss:  None. Estimated Blood Loss:     Estimated blood loss: none. Impression:               - Internal hemorrhoids.                           - The examination was otherwise normal on direct                            and retroflexion views.                           - No specimens collected. Recommendation:           - Repeat colonoscopy in 10 years for screening                            purposes.                           - Patient has a contact number available for                            emergencies. The signs and symptoms of potential                            delayed complications were discussed with the                            patient. Return to normal activities tomorrow.                            Written discharge instructions were provided to the                            patient.                           -  Resume previous diet.                           - Continue present medications. Ladene Artist, MD 12/22/2018 9:24:51 AM This report has been signed electronically.

## 2018-12-22 NOTE — Patient Instructions (Signed)
Hand out on hemorrhoids given  YOU HAD AN ENDOSCOPIC PROCEDURE TODAY AT THE [facility]:   Refer to the procedure report that was given to you for any specific questions about what was found during the examination.  If the procedure report does not answer your questions, please call your gastroenterologist to clarify.  If you requested that your care partner not be given the details of your procedure findings, then the procedure report has been included in a sealed envelope for you to review at your convenience later.  YOU SHOULD EXPECT: Some feelings of bloating in the abdomen. Passage of more gas than usual.  Walking can help get rid of the air that was put into your GI tract during the procedure and reduce the bloating. If you had a lower endoscopy (such as a colonoscopy or flexible sigmoidoscopy) you may notice spotting of blood in your stool or on the toilet paper. If you underwent a bowel prep for your procedure, you may not have a normal bowel movement for a few days.  Please Note:  You might notice some irritation and congestion in your nose or some drainage.  This is from the oxygen used during your procedure.  There is no need for concern and it should clear up in a day or so.  SYMPTOMS TO REPORT IMMEDIATELY:   Following lower endoscopy (colonoscopy or flexible sigmoidoscopy):  Excessive amounts of blood in the stool  Significant tenderness or worsening of abdominal pains  Swelling of the abdomen that is new, acute  Fever of 100F or higher   For urgent or emergent issues, a gastroenterologist can be reached at any hour by calling [phone].   DIET:  We do recommend a small meal at first, but then you may proceed to your regular diet.  Drink plenty of fluids but you should avoid alcoholic beverages for 24 hours.  ACTIVITY:  You should plan to take it easy for the rest of today and you should NOT DRIVE or use heavy machinery until tomorrow (because of the sedation  medicines used during the test).    FOLLOW UP: Our staff will call the number listed on your records the next business day following your procedure to check on you and address any questions or concerns that you may have regarding the information given to you following your procedure. If we do not reach you, we will leave a message.  However, if you are feeling well and you are not experiencing any problems, there is no need to return our call.  We will assume that you have returned to your regular daily activities without incident.  If any biopsies were taken you will be contacted by phone or by letter within the next 1-3 weeks.  Please call us at [phone] if you have not heard about the biopsies in 3 weeks.    SIGNATURES/CONFIDENTIALITY: You and/or your care partner have signed paperwork which will be entered into your electronic medical record.  These signatures attest to the fact that that the information above on your After Visit Summary has been reviewed and is understood.  Full responsibility of the confidentiality of this discharge information lies with you and/or your care-partner. 

## 2018-12-23 ENCOUNTER — Telehealth: Payer: Self-pay

## 2018-12-23 NOTE — Telephone Encounter (Signed)
  Follow up Call-  Call back number 12/22/2018  Post procedure Call Back phone  # [phone]  Permission to leave phone message Yes  Some recent data might be hidden     Patient questions:  Do you have a fever, pain , or abdominal swelling? No. Pain Score  0 *  Have you tolerated food without any problems? Yes.    Have you been able to return to your normal activities? Yes.    Do you have any questions about your discharge instructions: Diet   No. Medications  No. Follow up visit  No.  Do you have questions or concerns about your Care? No.  Actions: * If pain score is 4 or above: No action needed, pain <4.

## 2018-12-23 NOTE — Telephone Encounter (Signed)
Left message on f/u call 

## 2019-07-25 ENCOUNTER — Other Ambulatory Visit: Payer: Self-pay | Admitting: Family Medicine

## 2019-07-27 NOTE — Telephone Encounter (Signed)
Please call patient and schedule CPE.  Send back to CMA for refill after appointment is scheduled.

## 2019-07-28 NOTE — Telephone Encounter (Signed)
Pt is scheduled for labs 09/09/19 and cpe on 09/16/19.

## 2019-08-12 ENCOUNTER — Other Ambulatory Visit: Payer: Self-pay | Admitting: Family Medicine

## 2019-09-07 ENCOUNTER — Other Ambulatory Visit: Payer: Self-pay | Admitting: Family Medicine

## 2019-09-07 DIAGNOSIS — E785 Hyperlipidemia, unspecified: Secondary | ICD-10-CM

## 2019-09-07 DIAGNOSIS — E039 Hypothyroidism, unspecified: Secondary | ICD-10-CM

## 2019-09-07 DIAGNOSIS — Z125 Encounter for screening for malignant neoplasm of prostate: Secondary | ICD-10-CM

## 2019-09-09 ENCOUNTER — Other Ambulatory Visit (INDEPENDENT_AMBULATORY_CARE_PROVIDER_SITE_OTHER): Payer: BC Managed Care – PPO

## 2019-09-09 DIAGNOSIS — E785 Hyperlipidemia, unspecified: Secondary | ICD-10-CM | POA: Diagnosis not present

## 2019-09-09 DIAGNOSIS — E039 Hypothyroidism, unspecified: Secondary | ICD-10-CM

## 2019-09-09 DIAGNOSIS — Z125 Encounter for screening for malignant neoplasm of prostate: Secondary | ICD-10-CM | POA: Diagnosis not present

## 2019-09-09 LAB — LIPID PANEL
Cholesterol: 137 mg/dL (ref 0–200)
HDL: 30 mg/dL — ABNORMAL LOW (ref >39.00)
LDL Cholesterol: 78 mg/dL (ref 0–99)
NonHDL: 106.69
Total CHOL/HDL Ratio: 5
Triglycerides: 145 mg/dL (ref 0.0–149.0)
VLDL: 29 mg/dL (ref 0.0–40.0)

## 2019-09-09 LAB — COMPREHENSIVE METABOLIC PANEL
ALT: 42 U/L (ref 0–53)
AST: 24 U/L (ref 0–37)
Albumin: 4.2 g/dL (ref 3.5–5.2)
Alkaline Phosphatase: 71 U/L (ref 39–117)
BUN: 11 mg/dL (ref 6–23)
CO2: 32 mEq/L (ref 19–32)
Calcium: 9 mg/dL (ref 8.4–10.5)
Chloride: 104 mEq/L (ref 96–112)
Creatinine, Ser: 0.96 mg/dL (ref 0.40–1.50)
GFR: 79.29 mL/min (ref >60.00)
Glucose, Bld: 96 mg/dL (ref 70–99)
Potassium: 4.2 mEq/L (ref 3.5–5.1)
Sodium: 141 mEq/L (ref 135–145)
Total Bilirubin: 0.6 mg/dL (ref 0.2–1.2)
Total Protein: 6.5 g/dL (ref 6.0–8.3)

## 2019-09-09 LAB — PSA: PSA: 2.89 ng/mL (ref 0.10–4.00)

## 2019-09-09 LAB — TSH: TSH: 0.59 u[IU]/mL (ref 0.35–4.50)

## 2019-09-16 ENCOUNTER — Ambulatory Visit (INDEPENDENT_AMBULATORY_CARE_PROVIDER_SITE_OTHER): Payer: BC Managed Care – PPO | Admitting: Family Medicine

## 2019-09-16 ENCOUNTER — Encounter: Payer: Self-pay | Admitting: Family Medicine

## 2019-09-16 ENCOUNTER — Other Ambulatory Visit: Payer: Self-pay

## 2019-09-16 VITALS — BP 134/80 | HR 83 | Ht 67.0 in | Wt 197.4 lb

## 2019-09-16 DIAGNOSIS — Z7189 Other specified counseling: Secondary | ICD-10-CM

## 2019-09-16 DIAGNOSIS — F329 Major depressive disorder, single episode, unspecified: Secondary | ICD-10-CM

## 2019-09-16 DIAGNOSIS — Z0000 Encounter for general adult medical examination without abnormal findings: Secondary | ICD-10-CM

## 2019-09-16 DIAGNOSIS — G4700 Insomnia, unspecified: Secondary | ICD-10-CM

## 2019-09-16 DIAGNOSIS — E039 Hypothyroidism, unspecified: Secondary | ICD-10-CM

## 2019-09-16 DIAGNOSIS — E785 Hyperlipidemia, unspecified: Secondary | ICD-10-CM

## 2019-09-16 DIAGNOSIS — Z23 Encounter for immunization: Secondary | ICD-10-CM | POA: Diagnosis not present

## 2019-09-16 DIAGNOSIS — N400 Enlarged prostate without lower urinary tract symptoms: Secondary | ICD-10-CM

## 2019-09-16 DIAGNOSIS — R1012 Left upper quadrant pain: Secondary | ICD-10-CM

## 2019-09-16 DIAGNOSIS — F419 Anxiety disorder, unspecified: Secondary | ICD-10-CM

## 2019-09-16 DIAGNOSIS — I10 Essential (primary) hypertension: Secondary | ICD-10-CM

## 2019-09-16 DIAGNOSIS — M1990 Unspecified osteoarthritis, unspecified site: Secondary | ICD-10-CM

## 2019-09-16 MED ORDER — DICLOFENAC SODIUM 75 MG PO TBEC: DELAYED_RELEASE_TABLET | ORAL | 3 refills | Status: DC

## 2019-09-16 MED ORDER — VALSARTAN 80 MG PO TABS: 80.0000 mg | ORAL_TABLET | Freq: Every day | ORAL | 3 refills | Status: DC

## 2019-09-16 MED ORDER — SIMVASTATIN 20 MG PO TABS: 20.0000 mg | ORAL_TABLET | Freq: Every day | ORAL | 3 refills | Status: DC

## 2019-09-16 MED ORDER — LEVOTHYROXINE SODIUM 112 MCG PO TABS: 112.0000 ug | ORAL_TABLET | Freq: Every day | ORAL | 3 refills | Status: DC

## 2019-09-16 MED ORDER — CITALOPRAM HYDROBROMIDE 20 MG PO TABS: 20.0000 mg | ORAL_TABLET | Freq: Every day | ORAL | 3 refills | Status: DC

## 2019-09-16 NOTE — Patient Instructions (Addendum)
Check with your insurance to see if they will cover the shingrix shot. Flu shot today.  Update me as needed.  Take care.  Glad to see you.

## 2019-09-16 NOTE — Progress Notes (Signed)
CPE- See plan.  Routine anticipatory guidance given to patient.  See health maintenance.  The possibility exists that previously documented standard health maintenance information may have been brought forward from a previous encounter into this note.  If needed, that same information has been updated to reflect the current situation based on today's encounter.    Tetanus 2013 PNA due at 66 Shingles shot d/w pt. see avs.   Flu shot done at work Colonoscopy prev done 2009- he'll call about f/u.   PSA wnl. Higher than prev in 2020 but still normal.   Living will d/w pt. Would have his sister Alan Ripper designated if patient were incapacitated. Diet and exercise d/w pt. Encouraged both- "I've gotten better with both."   HCV and HIV screening neg 2018.  Mood is good.  No ADE on med.  Compliant.  He wanted to continue as is.  No ADE on NSAID for joint pain.  Used as needed.  Routine cautions discussed.  He has episodic nocturia.  Stream is slower then prev.  Not bothersome enough to treat at this point per patient report.  He'll update me as needed.    Hypertension:  Using medication without problems or lightheadedness: yes Chest pain with exertion:no Edema:no Short of breath:no  Elevated Cholesterol: Using medications without problems:yes Muscle aches: no Diet compliance: yes Exercise:yes  Hypothyroidism.  No ADE on med, compliant.  No neck mass, no dysphagia.    Insomnia.  Ambien used as needed.  No ADE on med.   His prev thorax pain isn't worse, is some better from prev, intermittently noted but not going on more than prev.  The pressure sensation is some better.  Reasonable to defer further w/u at this point, d/w pt.  He likely has a functional bowel issue that causes his sx intermittently, d/w pt.   PMH and SH reviewed  Meds, vitals, and allergies reviewed.   ROS: Per HPI.  Unless specifically indicated otherwise in HPI, the patient denies:  General: fever. Eyes: acute  vision changes ENT: sore throat Cardiovascular: chest pain Respiratory: SOB GI: vomiting GU: dysuria Musculoskeletal: acute back pain Derm: acute rash Neuro: acute motor dysfunction Psych: worsening mood Endocrine: polydipsia Heme: bleeding Allergy: hayfever  GEN: nad, alert and oriented HEENT: ncat NECK: supple w/o LA CV: rrr. PULM: ctab, no inc wob ABD: soft, +bs EXT: no edema SKIN: no acute rash

## 2019-09-19 DIAGNOSIS — N400 Enlarged prostate without lower urinary tract symptoms: Secondary | ICD-10-CM | POA: Insufficient documentation

## 2019-09-19 NOTE — Assessment & Plan Note (Signed)
His prev thorax pain isn't worse, is some better from prev, intermittently noted but not going on more than prev.  The pressure sensation is some better.  Reasonable to defer further w/u at this point, d/w pt.  He likely has a functional bowel issue that causes his sx intermittently, d/w pt. he will update me as needed, he agrees.

## 2019-09-19 NOTE — Assessment & Plan Note (Signed)
Ambien used as needed.  No ADE on med.  Continue as is.  He agrees.

## 2019-09-19 NOTE — Assessment & Plan Note (Signed)
Living will d/w pt.  Would have his sister Sally Greene designated if patient were incapacitated. ?

## 2019-09-19 NOTE — Assessment & Plan Note (Signed)
Mood is good.  No ADE on med.  Compliant.  He wanted to continue as is.

## 2019-09-19 NOTE — Assessment & Plan Note (Signed)
No change in meds.  Continue work on diet and exercise.  He will update me as needed.  Labs discussed with patient.

## 2019-09-19 NOTE — Assessment & Plan Note (Signed)
No change in meds.  Continue as is.  Labs discussed with patient.  He agrees.

## 2019-09-19 NOTE — Assessment & Plan Note (Signed)
No ADE on NSAID for joint pain.  Used as needed.  Routine cautions discussed.

## 2019-09-19 NOTE — Assessment & Plan Note (Signed)
He has episodic nocturia.  Stream is slower then prev.  Not bothersome enough to treat at this point per patient report.  He'll update me as needed.

## 2019-09-19 NOTE — Assessment & Plan Note (Signed)
Tetanus 2013 PNA due at 66 Shingles shot d/w pt. see avs.   Flu shot done at work Colonoscopy prev done 2009- he'll call about f/u.   PSA wnl. Higher than prev in 2020 but still normal.   Living will d/w pt. Would have his sister Alan Ripper designated if patient were incapacitated. Diet and exercise d/w pt. Encouraged both- "I've gotten better with both."   HCV and HIV screening neg 2018.

## 2019-09-20 NOTE — Addendum Note (Signed)
Addended by: Virl Cagey on: 09/20/2019 08:44 AM   Modules accepted: Orders

## 2019-12-05 ENCOUNTER — Ambulatory Visit (INDEPENDENT_AMBULATORY_CARE_PROVIDER_SITE_OTHER): Payer: PRIVATE HEALTH INSURANCE | Admitting: Family Medicine

## 2019-12-05 ENCOUNTER — Telehealth: Payer: Self-pay

## 2019-12-05 ENCOUNTER — Encounter: Payer: Self-pay | Admitting: Family Medicine

## 2019-12-05 ENCOUNTER — Other Ambulatory Visit: Payer: Self-pay

## 2019-12-05 VITALS — BP 118/72 | HR 67 | Temp 97.4°F | Ht 67.0 in | Wt 201.7 lb

## 2019-12-05 DIAGNOSIS — H8110 Benign paroxysmal vertigo, unspecified ear: Secondary | ICD-10-CM | POA: Insufficient documentation

## 2019-12-05 MED ORDER — MECLIZINE HCL 25 MG PO TABS: 12.5000 mg | ORAL_TABLET | Freq: Three times a day (TID) | ORAL | 0 refills | Status: DC | PRN

## 2019-12-05 NOTE — Patient Instructions (Addendum)
Likely BPV.  Use meclizine as needed along with the bedside exercises. Update me if not better.  Take care.  Glad to see you.

## 2019-12-05 NOTE — Progress Notes (Signed)
This visit occurred during the SARS-CoV-2 public health emergency.  Safety protocols were in place, including screening questions prior to the visit, additional usage of staff PPE, and extensive cleaning of exam room while observing appropriate contact time as indicated for disinfecting solutions.  Vertigo.  No new meds.  Yesterday AM woke up with a normal day, got out of bed like normal, then the room started spinning.  Sat down to try get get better.  Was able to get to the bathroom by holding onto the wall. Then got nauseated/gagging.  Then sx got better yesterday, less severe later in the day.  Sx got worse again this AM.  Mornings tend to be worse for patient.  No syncope.  He can get lightheaded with position change.  No hearing loss other than L ear at baseline since childhood.  He had a driver for today.    Sx worse with head movement but not with eye tracking.  In retrospect, he may have had episodic mild sx prior to this weekend.  He didn't know if he needed rx change on his glasses.  No fevers, no chills.  No focal neuro changes o/w.      Meds, vitals, and allergies reviewed.  ROS: Per HPI unless specifically indicated in ROS section   GEN: nad, alert and oriented HEENT: R TM wnl, L TM with old chronic changes.   NECK: supple w/o LA CV: rrr.  PULM: ctab, no inc wob ABD: soft, +bs EXT: no edema CN 2-12 wnl B, S/S/DTR wnl x4 DHP positive when moving from supine and head turned to the L to sitting up and head forward.   Not lightheaded on standing.

## 2019-12-05 NOTE — Telephone Encounter (Signed)
Thanks

## 2019-12-05 NOTE — Assessment & Plan Note (Signed)
Likely BPV, okay for outpatient f/u.  He agrees.  Can use meclizine and bedside exercise.  Routine cautions d/w pt.  He'll update me as needed.

## 2019-12-05 NOTE — Telephone Encounter (Signed)
Pt started on 12/04/19 with dizziness where room spins. Pt said even when sitting or laying still pt is dizzy. Nausea but no vomiting. No H/A,CP or SOB. UC & ED precautions given and pt voiced understanding. Pt scheduled in office 30' appt with Dr Micheal Jones today at 10 AM.

## 2019-12-16 ENCOUNTER — Other Ambulatory Visit: Payer: Self-pay | Admitting: Family Medicine

## 2020-01-16 DIAGNOSIS — R55 Syncope and collapse: Secondary | ICD-10-CM

## 2020-01-16 HISTORY — DX: Syncope and collapse: R55

## 2020-01-27 ENCOUNTER — Other Ambulatory Visit: Payer: Self-pay

## 2020-01-27 ENCOUNTER — Emergency Department
Admission: EM | Admit: 2020-01-27 | Discharge: 2020-01-27 | Disposition: A | Discharge status: Home / Self Care | Payer: No Typology Code available for payment source | Attending: Emergency Medicine | Admitting: Emergency Medicine

## 2020-01-27 ENCOUNTER — Telehealth: Payer: Self-pay

## 2020-01-27 ENCOUNTER — Emergency Department: Payer: No Typology Code available for payment source

## 2020-01-27 DIAGNOSIS — Z7982 Long term (current) use of aspirin: Secondary | ICD-10-CM | POA: Insufficient documentation

## 2020-01-27 DIAGNOSIS — Z79899 Other long term (current) drug therapy: Secondary | ICD-10-CM | POA: Insufficient documentation

## 2020-01-27 DIAGNOSIS — R55 Syncope and collapse: Secondary | ICD-10-CM | POA: Insufficient documentation

## 2020-01-27 DIAGNOSIS — I10 Essential (primary) hypertension: Secondary | ICD-10-CM | POA: Insufficient documentation

## 2020-01-27 DIAGNOSIS — E039 Hypothyroidism, unspecified: Secondary | ICD-10-CM | POA: Insufficient documentation

## 2020-01-27 LAB — CBC
HCT: 48.4 % (ref 39.0–52.0)
Hemoglobin: 16.2 g/dL (ref 13.0–17.0)
MCH: 31.5 pg (ref 26.0–34.0)
MCHC: 33.5 g/dL (ref 30.0–36.0)
MCV: 94.2 fL (ref 80.0–100.0)
Platelets: 217 10*3/uL (ref 150–400)
RBC: 5.14 MIL/uL (ref 4.22–5.81)
RDW: 12.2 % (ref 11.5–15.5)
WBC: 8.1 10*3/uL (ref 4.0–10.5)
nRBC: 0 % (ref 0.0–0.2)

## 2020-01-27 LAB — BASIC METABOLIC PANEL
Anion gap: 5 (ref 5–15)
BUN: 15 mg/dL (ref 8–23)
CO2: 28 mmol/L (ref 22–32)
Calcium: 9 mg/dL (ref 8.9–10.3)
Chloride: 105 mmol/L (ref 98–111)
Creatinine, Ser: 0.96 mg/dL (ref 0.61–1.24)
GFR calc Af Amer: >60 mL/min (ref >60)
GFR calc non Af Amer: >60 mL/min (ref >60)
Glucose, Bld: 103 mg/dL — ABNORMAL HIGH (ref 70–99)
Potassium: 4.6 mmol/L (ref 3.5–5.1)
Sodium: 138 mmol/L (ref 135–145)

## 2020-01-27 MED ORDER — MECLIZINE HCL 25 MG PO TABS: 12.5000 mg | ORAL_TABLET | Freq: Three times a day (TID) | ORAL | 1 refills | Status: DC | PRN

## 2020-01-27 MED ORDER — SODIUM CHLORIDE 0.9% FLUSH: 3.0000 mL | Freq: Once | INTRAVENOUS | Status: DC

## 2020-01-27 NOTE — Telephone Encounter (Signed)
rx sent.  Will await ER note.  Thanks.

## 2020-01-27 NOTE — Discharge Instructions (Signed)
Please call the number provided for cardiology to arrange a follow-up appointment for further evaluation and consideration of possible Holter monitor.  Return to the emergency department for any further syncopal events/passing out, if you develop any chest pain, trouble breathing, or any other symptom personally concerning to yourself.

## 2020-01-27 NOTE — ED Triage Notes (Signed)
Pt states he has been getting treated for vertigo for the past month and early this morning he states he went to get up to the BR , states he remembers putting his feet on the floor than being on the floor, states he hit his head on the bed, pt has swelling, bruising around the left eye with an abrasion noted. Pt is a/ox4 with a steady gait on arrival. Denies being on a blood thinner.

## 2020-01-27 NOTE — ED Provider Notes (Signed)
[facility] Emergency Department Provider Note  Time seen: 12:58 PM  I have reviewed the triage vital signs and the nursing notes.   HISTORY  Chief Complaint Loss of Consciousness   HPI Micheal Jones is a 64 y.o. male with a past medical history of depression, hypertension, hyperlipidemia, vertigo, presents to the emergency department for syncopal episode.  According to the patient he woke around 3:00 this morning to use the restroom which is pretty typical for the patient.  He states he stood up to go use the restroom and next thing he knew he was falling hitting his head.  States he was immediately awake upon hitting his head.  Does not know if he completely lost consciousness.  Patient takes his medications including blood pressure medications first thing in the morning.  Had not taken them yet.  Patient denies any recent illnesses.  Denies any chest pain or shortness of breath at any point.  Patient states he was just recently diagnosed with vertigo approximately 2 months ago, is not currently taking anything for vertigo but again did not feel dizzy this morning.  Currently patient appears well.  He did hit his forehead has a small abrasion just below the left eyebrow.   Past Medical History:  Diagnosis Date  . Abdominal pain    left lower abd pain/ on and off for 2 years  . Achilles tendon tear    right, partial, no surgery  . Anxiety   . Arthritis    C and L spine  . Depression   . GERD (gastroesophageal reflux disease)   . Hyperlipidemia   . Hypertension   . Hypothyroidism   . Insomnia   . Renal stones   . Sleep apnea    occ use of CPAP, trouble with mask fit    Patient Active Problem List   Diagnosis Date Noted  . BPV (benign positional vertigo) 12/05/2019  . BPH (benign prostatic hyperplasia) 09/19/2019  . Advance care planning 09/22/2014  . Vertebral body hemangioma 09/22/2014  . Abdominal pain, left upper quadrant 02/06/2014  . Fatty liver  06/03/2013  . Routine general medical examination at a health care facility 02/05/2012  . Arthritis 12/12/2009  . ACHILLES TENDON TEAR 02/13/2009  . GERD 07/13/2008  . INSOMNIA 07/13/2008  . Hypothyroidism 08/19/2007  . Hyperlipidemia 08/19/2007  . Anxiety and depression 08/19/2007  . Essential hypertension 08/19/2007  . RENAL CALCULUS, HX OF 08/19/2007    Past Surgical History:  Procedure Laterality Date  . COLONOSCOPY    . LITHOTRIPSY     x 2  . THYROIDECTOMY     partial  . TONSILLECTOMY      Prior to Admission medications   Medication Sig Start Date End Date Taking? Authorizing Provider  aspirin 81 MG tablet Take 81 mg by mouth daily.    [provider]  citalopram (CELEXA) 20 MG tablet Take 1 tablet (20 mg total) by mouth daily. 09/16/19   Tonia Ghent, MD  diclofenac (VOLTAREN) 75 MG EC tablet TAKE 1 TABLET BY MOUTH TWICE A DAY WITH FOOD 09/16/19   Tonia Ghent, MD  levothyroxine (SYNTHROID) 112 MCG tablet Take 1 tablet (112 mcg total) by mouth daily before breakfast. 09/16/19   Tonia Ghent, MD  meclizine (ANTIVERT) 25 MG tablet Take 0.5-1 tablets (12.5-25 mg total) by mouth 3 (three) times daily as needed for dizziness. 01/27/20   Tonia Ghent, MD  nitroGLYCERIN (NITROSTAT) 0.4 MG SL tablet Place 1 tablet (  0.4 mg total) under the tongue every 5 (five) minutes as needed for chest pain. 08/18/18 08/02/23  Tonia Ghent, MD  simvastatin (ZOCOR) 20 MG tablet TAKE 1 TABLET BY MOUTH EVERYDAY AT BEDTIME 12/16/19   Tonia Ghent, MD  valsartan (DIOVAN) 80 MG tablet Take 1 tablet (80 mg total) by mouth daily. 09/16/19   Tonia Ghent, MD  zolpidem (AMBIEN) 10 MG tablet Take 1 tablet (10 mg total) by mouth at bedtime as needed. 05/22/17   Tonia Ghent, MD    No Known Allergies  Family History  Problem Relation Age of Onset  . Stroke Mother   . Coronary artery disease Mother   . Suicidality Father   . Coronary artery disease Brother   . Heart  disease Brother   . Healthy Sister   . Colon cancer Neg Hx   . Prostate cancer Neg Hx     Social History Social History   Tobacco Use  . Smoking status: Never Smoker  . Smokeless tobacco: Never Used  Substance Use Topics  . Alcohol use: Yes    Comment: occasional  . Drug use: No    Review of Systems Constitutional: Negative for fever. Cardiovascular: Negative for chest pain. Respiratory: Negative for shortness of breath. Gastrointestinal: Negative for abdominal pain Musculoskeletal: Negative for musculoskeletal complaints Neurological: Negative for headache All other ROS negative  ____________________________________________   PHYSICAL EXAM:  VITAL SIGNS: ED Triage Vitals  Enc Vitals Group     BP 01/27/20 0930 (!) 147/76     Pulse Rate 01/27/20 0930 84     Resp 01/27/20 0930 18     Temp 01/27/20 0930 98.8 F (37.1 C)     Temp Source 01/27/20 0930 Oral     SpO2 01/27/20 0930 98 %     Weight 01/27/20 0943 198 lb (89.8 kg)     Height 01/27/20 0943 5\' 7"  (1.702 m)     Head Circumference --      Peak Flow --      Pain Score 01/27/20 0943 0     Pain Loc --      Pain Edu? --      Excl. in [place]? --     Constitutional: Alert and oriented. Well appearing and in no distress. Eyes: Normal exam ENT      Head: Normocephalic and atraumatic.      Mouth/Throat: Mucous membranes are moist. Cardiovascular: Normal rate, regular rhythm.  Respiratory: Normal respiratory effort without tachypnea nor retractions. Breath sounds are clear Gastrointestinal: Soft and nontender. No distention.  Musculoskeletal: Nontender with normal range of motion in all extremities.   Neurologic:  Normal speech and language. No gross focal neurologic deficits  Skin:  Skin is warm, dry and intact.  Psychiatric: Mood and affect are normal.   ____________________________________________    EKG  EKG viewed and interpreted by myself shows a normal sinus rhythm at 78 bpm with a narrow QRS, normal  axis, normal intervals, no concerning ST changes.  Normal EKG.  ____________________________________________    RADIOLOGY  CT head negative  ____________________________________________   INITIAL IMPRESSION / ASSESSMENT AND PLAN / ED COURSE  Pertinent labs & imaging results that were available during my care of the patient were reviewed by me and considered in my medical decision making (see chart for details).   Patient presents emergency department after a syncopal episode 3:00 this morning.  Patient states he stood up out of his bed to go use the restroom and fell  over hitting his head.  Is not sure if he completely lost consciousness.  No chest pain or shortness of breath at any point.  Denies any dizziness.  Patient symptoms are very suggestive of orthostatic syncope.  Patient's work-up is reassuring including normal labs, normal CT and a reassuring and normal-appearing EKG.  However given the patient has no prior syncopal episodes I did discuss with the patient follow-up with cardiologist for consideration of a Holter monitor.  I also discussed supportive care at home including significant oral hydration and plenty of rest over the next several days.  Provided my normal syncope return precautions.  Patient agreeable to plan of care.  Micheal Jones was evaluated in Emergency Department on 01/27/2020 for the symptoms described in the history of present illness. He was evaluated in the context of the global COVID-19 pandemic, which necessitated consideration that the patient might be at risk for infection with the SARS-CoV-2 virus that causes COVID-19. Institutional protocols and algorithms that pertain to the evaluation of patients at risk for COVID-19 are in a state of rapid change based on information released by regulatory bodies including the CDC and federal and state organizations. These policies and algorithms were followed during the patient's care in the  ED.  ____________________________________________   FINAL CLINICAL IMPRESSION(S) / ED DIAGNOSES  Syncope   Harvest Dark, MD 01/27/20 1301

## 2020-01-27 NOTE — Telephone Encounter (Signed)
Pt said this morning at 4 AM pt got up to go to bathroom; pt passed out and fell and hit lt eye on bedrail and has approx 1/2" laceration at lt eyebrow. Lt eye is swollen and very sore; pt is not bleeding now. No H/A or blurred vision. Pt has been being treated for vertigo and is presently out of meclizine and request refill of meclizine to [facility] [place] will go to [facility] ED now for eval and treatment.

## 2020-02-02 ENCOUNTER — Ambulatory Visit: Payer: Commercial Managed Care - PPO | Admitting: Cardiology

## 2020-02-10 ENCOUNTER — Encounter: Payer: Self-pay | Admitting: Internal Medicine

## 2020-02-10 ENCOUNTER — Other Ambulatory Visit: Payer: Self-pay

## 2020-02-10 ENCOUNTER — Ambulatory Visit (INDEPENDENT_AMBULATORY_CARE_PROVIDER_SITE_OTHER): Payer: PRIVATE HEALTH INSURANCE | Admitting: Internal Medicine

## 2020-02-10 VITALS — BP 118/80 | Ht 67.0 in | Wt 202.4 lb

## 2020-02-10 DIAGNOSIS — R55 Syncope and collapse: Secondary | ICD-10-CM | POA: Diagnosis not present

## 2020-02-10 DIAGNOSIS — I10 Essential (primary) hypertension: Secondary | ICD-10-CM | POA: Diagnosis not present

## 2020-02-10 DIAGNOSIS — G4733 Obstructive sleep apnea (adult) (pediatric): Secondary | ICD-10-CM

## 2020-02-10 NOTE — Progress Notes (Signed)
New Outpatient Visit Date: 02/10/2020  Referring Provider: Harvest Dark, MD [facility] Emergency Department  Chief Complaint: Syncope  HPI:  Mr. Frappier is a 64 y.o. male who is being seen today for the evaluation of syncope at the request of Dr. Leta Speller. He has a history of hypertension, hyperlipidemia, vertigo, and depression.  He presented to the [facility] emergency department on 01/27/2020 after getting up around 3 AM to use the bathroom and falling.  He was noted to have a small abrasion just below the left eyebrow.  ED work-up was unrevealing.  Mr. Robledo reports that he was diagnosed with vertigo several months ago after experiencing frequent dizziness.  He was prescribed meclizine with significant improvement in his symptoms.  Leading up to his syncope event earlier this month, Mr. Rineer had felt well.  He got up to use the bathroom and remembers his "feet hitting the floor."   The next thing he remembers is being on the ground, though he estimates that he only blacked out for a few seconds.  He suffered a cut above the left eye but otherwise no significant injury.  He did not have any prodromal symptoms, including chest pain, palpitations, or lightheadedness.  He has never passed out before, but notes that he awoke on the floor about 4 months ago.  He thought that he had just rolled out of bed in his sleep.  On the night of his recent syncopal episode, Mr. Affeldt had first fallen asleep in front of the TV and later went to bed.  He was not wearing CPAP that night.  Mr. Yamashiro reports having undergone a stress test >20 years go for evaluation of chest pain that was attributed to GERD.  He has a prescription for sublingual nitroglycerin but does not recall exactly why he was given this.  He has never needed to use it.  He was seen once in 2011 by Dr. Rockey Situ in our office for evaluation of shortness of breath and arm pain in the setting of a family history of coronary artery  disease.  --------------------------------------------------------------------------------------------------  Cardiovascular History & Procedures: Cardiovascular Problems:  Syncope  Risk Factors:  Hypertension, hyperlipidemia, male gender, family history, and age greater than 51  Cath/PCI:  None  CV Surgery:  None  EP Procedures and Devices:  None  Non-Invasive Evaluation(s):  None  Recent CV Pertinent Labs: Lab Results  Component Value Date   CHOL 137 09/09/2019   HDL 30.00 (L) 09/09/2019   LDLCALC 78 09/09/2019   LDLDIRECT 172.1 07/28/2013   TRIG 145.0 09/09/2019   CHOLHDL 5 09/09/2019   K 4.6 01/27/2020   BUN 15 01/27/2020   CREATININE 0.96 01/27/2020    --------------------------------------------------------------------------------------------------  Past Medical History:  Diagnosis Date  . Abdominal pain    left lower abd pain/ on and off for 2 years  . Achilles tendon tear    right, partial, no surgery  . Anxiety   . Arthritis    C and L spine  . Depression   . GERD (gastroesophageal reflux disease)   . Hyperlipidemia   . Hypertension   . Hypothyroidism   . Insomnia   . Renal stones   . Sleep apnea    occ use of CPAP, trouble with mask fit    Past Surgical History:  Procedure Laterality Date  . COLONOSCOPY    . LITHOTRIPSY     x 2  . THYROIDECTOMY     partial  . TONSILLECTOMY      Current  Meds  Medication Sig  . aspirin 81 MG tablet Take 81 mg by mouth daily.  . citalopram (CELEXA) 20 MG tablet Take 1 tablet (20 mg total) by mouth daily.  . diclofenac (VOLTAREN) 75 MG EC tablet TAKE 1 TABLET BY MOUTH TWICE A DAY WITH FOOD  . levothyroxine (SYNTHROID) 112 MCG tablet Take 1 tablet (112 mcg total) by mouth daily before breakfast.  . meclizine (ANTIVERT) 25 MG tablet Take 0.5-1 tablets (12.5-25 mg total) by mouth 3 (three) times daily as needed for dizziness.  . nitroGLYCERIN (NITROSTAT) 0.4 MG SL tablet Place 1 tablet (0.4 mg  total) under the tongue every 5 (five) minutes as needed for chest pain.  . simvastatin (ZOCOR) 20 MG tablet TAKE 1 TABLET BY MOUTH EVERYDAY AT BEDTIME  . valsartan (DIOVAN) 80 MG tablet Take 1 tablet (80 mg total) by mouth daily.    Allergies: Patient has no known allergies.  Social History   Tobacco Use  . Smoking status: Never Smoker  . Smokeless tobacco: Never Used  Substance Use Topics  . Alcohol use: Yes    Alcohol/week: 1.0 standard drinks    Types: 1 Cans of beer per week  . Drug use: No    Family History  Problem Relation Age of Onset  . Stroke Mother   . Coronary artery disease Mother   . Suicidality Father   . Coronary artery disease Brother   . Heart disease Brother   . Healthy Sister   . Colon cancer Neg Hx   . Prostate cancer Neg Hx     Review of Systems: A 12-system review of systems was performed and was negative except as noted in the HPI.  --------------------------------------------------------------------------------------------------  Physical Exam: BP 118/80 (BP Location: Right Arm, Patient Position: Sitting, Cuff Size: Normal)   Ht 5\' 7"  (1.702 m)   Wt 202 lb 6 oz (91.8 kg)   SpO2 98%   BMI 31.70 kg/m   General:  NAD. HEENT: No conjunctival pallor or scleral icterus. Facemask in place. Neck: Supple without lymphadenopathy, thyromegaly, JVD, or HJR. No carotid bruit. Lungs: Normal work of breathing. Clear to auscultation bilaterally without wheezes or crackles. Heart: Regular rate and rhythm without murmurs, rubs, or gallops. Non-displaced PMI. Abd: Bowel sounds present. Soft, NT/ND without hepatosplenomegaly Ext: No lower extremity edema. Radial, PT, and DP pulses are 2+ bilaterally Skin: Warm and dry without rash. Neuro: CNIII-XII intact. Strength and fine-touch sensation intact in upper and lower extremities bilaterally. Psych: Normal mood and affect.  EKG:  NSR without significant abnormalities.  Lab Results  Component Value Date    WBC 8.1 01/27/2020   HGB 16.2 01/27/2020   HCT 48.4 01/27/2020   MCV 94.2 01/27/2020   PLT 217 01/27/2020    Lab Results  Component Value Date   NA 138 01/27/2020   K 4.6 01/27/2020   CL 105 01/27/2020   CO2 28 01/27/2020   BUN 15 01/27/2020   CREATININE 0.96 01/27/2020   GLUCOSE 103 (H) 01/27/2020   ALT 42 09/09/2019    Lab Results  Component Value Date   CHOL 137 09/09/2019   HDL 30.00 (L) 09/09/2019   LDLCALC 78 09/09/2019   LDLDIRECT 172.1 07/28/2013   TRIG 145.0 09/09/2019   CHOLHDL 5 09/09/2019    --------------------------------------------------------------------------------------------------  ASSESSMENT AND PLAN: Syncope: Mr. Zaman reports a single episode that occurred overnight when he got up to use the bathroom.  He believes that he only lost consciousness for a few seconds.  He denies  prodromal symptoms.  Orthostatic hypotension with possible component of vasovagal syncope seems most plausible, though abrupt onset with complete loss of consciousness is somewhat unusual.  Exam and EKG today are unrevealing, as was recent ED workup.  There is no evidence of orthostatic hypotension today.  I am not sure if dizziness over the last few months that has improved with meclizine could be related.  I have recommended that we obtain an echocardiogram, carotid Doppler, and 30 day event monitor.  We will defer medication changes at this time.  I have advised Mr. Royal to refrain from driving and operating heavy machinery for at least 6 months in the setting of unexplained syncope.  I have encouraged Mr. Bunker to stay well-hydrated and to minimize caffeine and alcohol consumption.  Hypertension: Blood pressure adequately controlled today.  We will defer medication changes.  Obstructive sleep apnea: Compliance with CPAP was encouraged.  Follow-up: Return to clinic in 2 months.  Nelva Bush, MD 02/10/2020 2:59 PM

## 2020-02-10 NOTE — Patient Instructions (Signed)
No driving for 6 months.  Medication Instructions:  Your physician recommends that you continue on your current medications as directed. Please refer to the Current Medication list given to you today.  *If you need a refill on your cardiac medications before your next appointment, please call your pharmacy*   Lab Work: none If you have labs (blood work) drawn today and your tests are completely normal, you will receive your results only by: Marland Kitchen MyChart Message (if you have MyChart) OR . A paper copy in the mail If you have any lab test that is abnormal or we need to change your treatment, we will call you to review the results.   Testing/Procedures: 1- Echocardiogram Your physician has requested that you have an echocardiogram. Echocardiography is a painless test that uses sound waves to create images of your heart. It provides your doctor with information about the size and shape of your heart and how well your heart's chambers and valves are working. This procedure takes approximately one hour. There are no restrictions for this procedure. You may get an IV, if needed, to receive an ultrasound enhancing agent through to better visualize your heart.   2- Carotid ultrasound - Your physician has requested that you have a carotid duplex. This test is an ultrasound of the carotid arteries in your neck. It looks at blood flow through these arteries that supply the brain with blood. Allow one hour for this exam. There are no restrictions or special instructions.  3- [employer] Event Monitor for 30 days- Your physician has recommended that you wear an event monitor. Event monitors are medical devices that record the heart's electrical activity. Doctors most often [place] these monitors to diagnose arrhythmias. Arrhythmias are problems with the speed or rhythm of the heartbeat. The monitor is a small, portable device. You can wear one while you do your normal daily activities. This is usually used to  diagnose what is causing palpitations/syncope (passing out). Someone from our [employer] who is in charge of the [employer] Event monitor will contact you about mailing you one.    Follow-Up: At [facility], you and your health needs are our priority.  As part of our continuing mission to provide you with exceptional heart care, we have created designated Provider Care Teams.  These Care Teams include your primary Cardiologist (physician) and Advanced Practice Providers (APPs -  Physician Assistants and Nurse Practitioners) who all work together to provide you with the care you need, when you need it.  We recommend signing up for the patient portal called "MyChart".  Sign up information is provided on this After Visit Summary.  MyChart is used to connect with patients for Virtual Visits (Telemedicine).  Patients are able to view lab/test results, encounter notes, upcoming appointments, etc.  Non-urgent messages can be sent to your provider as well.   To learn more about what you can do with MyChart, go to [email or web address].    Your next appointment:   2 month(s)  The format for your next appointment:   In Person  Provider:    You may see DR Harrell Gave END or one of the following Advanced Practice Providers on your designated Care Team:    Murray Hodgkins, NP  Christell Faith, PA-C  Marrianne Mood, PA-C   Echocardiogram An echocardiogram is a procedure that uses painless sound waves (ultrasound) to produce an image of the heart. Images from an echocardiogram can provide important information about:  Signs of coronary artery disease (CAD).  Aneurysm detection. An aneurysm is a weak or damaged part of an artery wall that bulges out from the normal force of blood pumping through the body.  Heart size and shape. Changes in the size or shape of the heart can be associated with certain conditions, including heart failure, aneurysm, and CAD.  Heart muscle  function.  Heart valve function.  Signs of a past heart attack.  Fluid buildup around the heart.  Thickening of the heart muscle.  A tumor or infectious growth around the heart valves. Tell a health care provider about:  Any allergies you have.  All medicines you are taking, including vitamins, herbs, eye drops, creams, and over-the-counter medicines.  Any blood disorders you have.  Any surgeries you have had.  Any medical conditions you have.  Whether you are pregnant or may be pregnant. What are the risks? Generally, this is a safe procedure. However, problems may occur, including:  Allergic reaction to dye (contrast) that may be used during the procedure. What happens before the procedure? No specific preparation is needed. You may eat and drink normally. What happens during the procedure?   An IV tube may be inserted into one of your veins.  You may receive contrast through this tube. A contrast is an injection that improves the quality of the pictures from your heart.  A gel will be applied to your chest.  A wand-like tool (transducer) will be moved over your chest. The gel will help to transmit the sound waves from the transducer.  The sound waves will harmlessly bounce off of your heart to allow the heart images to be captured in real-time motion. The images will be recorded on a computer. The procedure may vary among health care providers and hospitals. What happens after the procedure?  You may return to your normal, everyday life, including diet, activities, and medicines, unless your health care provider tells you not to do that. Summary  An echocardiogram is a procedure that uses painless sound waves (ultrasound) to produce an image of the heart.  Images from an echocardiogram can provide important information about the size and shape of your heart, heart muscle function, heart valve function, and fluid buildup around your heart.  You do not need to do  anything to prepare before this procedure. You may eat and drink normally.  After the echocardiogram is completed, you may return to your normal, everyday life, unless your health care provider tells you not to do that. This information is not intended to replace advice given to you by your health care provider. Make sure you discuss any questions you have with your health care provider. Document Revised: 02/24/2019 Document Reviewed: 12/06/2016 Elsevier Patient Education  [place].

## 2020-02-12 ENCOUNTER — Encounter: Payer: Self-pay | Admitting: Internal Medicine

## 2020-02-12 DIAGNOSIS — R55 Syncope and collapse: Secondary | ICD-10-CM | POA: Insufficient documentation

## 2020-02-12 DIAGNOSIS — G4733 Obstructive sleep apnea (adult) (pediatric): Secondary | ICD-10-CM | POA: Insufficient documentation

## 2020-02-13 ENCOUNTER — Encounter: Payer: Self-pay | Admitting: *Deleted

## 2020-02-13 NOTE — Progress Notes (Signed)
Patient ID: Micheal Jones, male   DOB: [birth year], 64 y.o.   MRN: [id number] Patient enrolled for [employer] to ship a 30 day cardiac event monitor to his home.  Instructions sent to patient via My Chart message and will also be included in the monitor kit.

## 2020-02-17 ENCOUNTER — Ambulatory Visit (INDEPENDENT_AMBULATORY_CARE_PROVIDER_SITE_OTHER): Payer: No Typology Code available for payment source

## 2020-02-17 DIAGNOSIS — R55 Syncope and collapse: Secondary | ICD-10-CM

## 2020-02-20 ENCOUNTER — Ambulatory Visit: Payer: PRIVATE HEALTH INSURANCE | Attending: Internal Medicine

## 2020-02-20 DIAGNOSIS — Z23 Encounter for immunization: Secondary | ICD-10-CM

## 2020-02-20 NOTE — Progress Notes (Signed)
   Covid-19 Vaccination Clinic  Name:  Micheal Jones    MRN: [id number] DOB: 16-Oct-1956  02/20/2020  Micheal Jones was observed post Covid-19 immunization for 15 minutes without incident. He was provided with Vaccine Information Sheet and instruction to access the V-Safe system.   Micheal Jones was instructed to call 911 with any severe reactions post vaccine: Marland Kitchen Difficulty breathing  . Swelling of face and throat  . A fast heartbeat  . A bad rash all over body  . Dizziness and weakness   Immunizations Administered    Name Date Dose VIS Date Route   Pfizer COVID-19 Vaccine 02/20/2020  9:42 AM 0.3 mL 10/28/2019 Intramuscular   Manufacturer: [place]   Lot: [id number]   [place]: [id number]

## 2020-03-14 ENCOUNTER — Telehealth: Payer: Self-pay | Admitting: Internal Medicine

## 2020-03-14 ENCOUNTER — Ambulatory Visit: Payer: PRIVATE HEALTH INSURANCE | Attending: Internal Medicine

## 2020-03-14 DIAGNOSIS — Z23 Encounter for immunization: Secondary | ICD-10-CM

## 2020-03-14 NOTE — Progress Notes (Signed)
   Covid-19 Vaccination Clinic  Name:  Micheal Jones    MRN: [id number] DOB: [birth year]  03/14/2020  Mr. Micheal Jones was observed post Covid-19 immunization for 15 minutes without incident. He was provided with Vaccine Information Sheet and instruction to access the V-Safe system.   Micheal Jones was instructed to call 911 with any severe reactions post vaccine: Marland Kitchen Difficulty breathing  . Swelling of face and throat  . A fast heartbeat  . A bad rash all over body  . Dizziness and weakness   Immunizations Administered    Name Date Dose VIS Date Route   Pfizer COVID-19 Vaccine 03/14/2020 10:03 AM 0.3 mL 01/11/2019 Intramuscular   Manufacturer: [place]   Lot: [id number]   [place]: [id number]

## 2020-03-14 NOTE — Telephone Encounter (Signed)
Called and instructed patient to call [employer] to let them know he needs more electrodes. He said his last day to wear it is this weekend.  Advised to wear the whole 30 days is preferred. He was appreciative and verbalized understanding.

## 2020-03-14 NOTE — Telephone Encounter (Signed)
Patient wearing monitor and has run out of electrodes. Patient was told to wear monitor for 30 days and started on 4/1. Patient wanting to know if he needs to get more electrodes or if he can be done wearing the monitor  Please advise

## 2020-03-23 ENCOUNTER — Other Ambulatory Visit: Payer: PRIVATE HEALTH INSURANCE

## 2020-04-11 ENCOUNTER — Ambulatory Visit: Payer: PRIVATE HEALTH INSURANCE | Admitting: Internal Medicine

## 2020-04-20 ENCOUNTER — Ambulatory Visit (INDEPENDENT_AMBULATORY_CARE_PROVIDER_SITE_OTHER): Payer: No Typology Code available for payment source

## 2020-04-20 ENCOUNTER — Other Ambulatory Visit: Payer: Self-pay

## 2020-04-20 DIAGNOSIS — R55 Syncope and collapse: Secondary | ICD-10-CM

## 2020-04-20 MED ORDER — PERFLUTREN LIPID MICROSPHERE
1.0000 mL | INTRAVENOUS | Status: AC | PRN
  Administered 2020-04-20: 2 mL via INTRAVENOUS

## 2020-04-25 ENCOUNTER — Ambulatory Visit (INDEPENDENT_AMBULATORY_CARE_PROVIDER_SITE_OTHER): Payer: No Typology Code available for payment source | Admitting: Family

## 2020-04-25 ENCOUNTER — Other Ambulatory Visit: Payer: Self-pay

## 2020-04-25 ENCOUNTER — Encounter: Payer: Self-pay | Admitting: Family

## 2020-04-25 VITALS — BP 124/78 | HR 79 | Ht 68.0 in | Wt 197.2 lb

## 2020-04-25 DIAGNOSIS — I6522 Occlusion and stenosis of left carotid artery: Secondary | ICD-10-CM | POA: Insufficient documentation

## 2020-04-25 DIAGNOSIS — E785 Hyperlipidemia, unspecified: Secondary | ICD-10-CM | POA: Diagnosis not present

## 2020-04-25 DIAGNOSIS — I10 Essential (primary) hypertension: Secondary | ICD-10-CM | POA: Diagnosis not present

## 2020-04-25 DIAGNOSIS — R55 Syncope and collapse: Secondary | ICD-10-CM | POA: Diagnosis not present

## 2020-04-25 DIAGNOSIS — G4733 Obstructive sleep apnea (adult) (pediatric): Secondary | ICD-10-CM

## 2020-04-25 DIAGNOSIS — I493 Ventricular premature depolarization: Secondary | ICD-10-CM

## 2020-04-25 NOTE — Progress Notes (Signed)
Office Visit    Patient Name: Micheal Jones Date of Encounter: 04/25/2020  Primary Care Provider:  Tonia Ghent, MD Primary Cardiologist:  Nelva Bush, MD Electrophysiologist:  None   Chief Complaint    Micheal Jones is a 64 y.o. male with a hx of syncope, HTN, HLD, vertigo, depression presents today for follow-up after cardiac testing for syncope  Past Medical History    Past Medical History:  Diagnosis Date   Abdominal pain    left lower abd pain/ on and off for 2 years   Achilles tendon tear    right, partial, no surgery   Anxiety    Arthritis    C and L spine   Depression    GERD (gastroesophageal reflux disease)    Hyperlipidemia    Hypertension    Hypothyroidism    Insomnia    Renal stones    Sleep apnea    occ use of CPAP, trouble with mask fit   Past Surgical History:  Procedure Laterality Date   COLONOSCOPY     LITHOTRIPSY     x 2   THYROIDECTOMY     partial   TONSILLECTOMY      Allergies  No Known Allergies  History of Present Illness    Micheal Jones is a 64 y.o. male with a hx of syncope, HTN, HLD, vertigo, OSA, depression last seen by Dr. Saunders Revel 02/10/2020.  He has a single father of a 67 year old daughter.  He works for [employer] in [place] but is presently working from home.  Presented to the [facility] ED 01/27/2020 after getting around 3 AM to use the restroom and falling.  Noted of small abrasion below left eyebrow.  ED work-up unrevealing.  He had been diagnosed with vertigo several months prior after experiencing frequent dizziness.  He was prescribed meclizine with significant improvement.  No noted prodromal symptoms for syncopal event.  Remote evaluation for chest pain with stress test greater than 20 years ago that was attributed to GERD.  When seen in clinic 02/10/2020 no evidence of orthostatic hypotension.  It was recommended for echocardiogram, carotid Doppler, 30-day event monitor.  He was recommended to refrain from  driving or operating heavy machinery for at least 6 months.  Carotid duplex 04/20/20 with left ICA 1-30% stenosis and right ICA with no plaque/stenosis. Echocardiogram 04/20/20 LVEF 60-65%, no RWMA, normal diastolic parameters, RV normal size and function, no significant valvular abnormalities. 20-day monitor with sinus rhythm with rare PVC. Average HR 71 bpm. Single episode of possible atrial fib/flutter that on review more consistent with artifact.  Reports no symptoms since last seen.  Had 1 day he felt dizzy but also had not eaten that day.  Other than that isolated episode denies lightheadedness, dizziness, near-syncope, syncope.  Reports no falls.  Has not had to use his meclizine for vertigo.   Reports no shortness of breath nor dyspnea on exertion. Reports no chest pain, pressure, or tightness. No edema, orthopnea, PND. Reports no palpitations.   Tells me he is trying to be very aware of how he feels to monitor for any symptoms.  We reviewed all of his cardiac testing including echo, carotid duplex, monitor in depth.  Does not monitor his blood pressure at home but plans to purchase a blood pressure cuff.  EKGs/Labs/Other Studies Reviewed:   The following studies were reviewed today:  30-day monitor 03/28/2020  The patient was enrolled for 35 days. 66% of the monitoring period yielded diagnostic  tracings.  The predominant rhythm was sinus with rare PVCs. Average heart rate was 71 bpm (range 52 to 121 bpm).  Single episode of possible atrial fibrillation/flutter occurred, though regular nature of QRS complex suggest possible underlying sinus rhythm with artifact. Episode lasted 24 minutes.   Predominately sinus rhythm with rare PVCs.  Question episode of atrial fibrillation/flutter versus artifact lasting 24 minutes.  Otherwise, no sustained arrhythmia or prolonged pauses.    Carotid duplex 04/20/2020 Summary:  Right Carotid: There was no evidence of thrombus, dissection,    atherosclerotic                plaque or stenosis in the cervical carotid system.   Left Carotid: Velocities in the left ICA are consistent with a 1-39%  stenosis.               Non-hemodynamically significant plaque <50% noted in the  CCA. The                ECA appears <50% stenosed.   Vertebrals:  Bilateral vertebral arteries demonstrate antegrade flow.  Subclavians: Normal flow hemodynamics were seen in bilateral subclavian               arteries.   Echo 04/20/2020  1. Left ventricular ejection fraction, by estimation, is 60 to 65%. The  left ventricle has normal function. The left ventricle has no regional  wall motion abnormalities. Left ventricular diastolic parameters were  normal.   2. Right ventricular systolic function is normal. The right ventricular  size is normal.   3. The mitral valve is normal in structure. No evidence of mitral valve  regurgitation. No evidence of mitral stenosis.   4. The aortic valve is normal in structure. Aortic valve regurgitation is  not visualized. No aortic stenosis is present.   5. The inferior vena cava is normal in size with greater than 50%  respiratory variability, suggesting right atrial pressure of 3 mmHg   EKG:  No EKG today.   Recent Labs: 09/09/2019: ALT 42; TSH 0.59 01/27/2020: BUN 15; Creatinine, Ser 0.96; Hemoglobin 16.2; Platelets 217; Potassium 4.6; Sodium 138  Recent Lipid Panel    Component Value Date/Time   CHOL 137 09/09/2019 0834   TRIG 145.0 09/09/2019 0834   HDL 30.00 (L) 09/09/2019 0834   CHOLHDL 5 09/09/2019 0834   VLDL 29.0 09/09/2019 0834   LDLCALC 78 09/09/2019 0834   LDLDIRECT 172.1 07/28/2013 1001    Home Medications   Current Meds  Medication Sig   aspirin 81 MG tablet Take 81 mg by mouth daily.   citalopram (CELEXA) 20 MG tablet Take 1 tablet (20 mg total) by mouth daily.   diclofenac (VOLTAREN) 75 MG EC tablet TAKE 1 TABLET BY MOUTH TWICE A DAY WITH FOOD   levothyroxine (SYNTHROID) 112 MCG  tablet Take 1 tablet (112 mcg total) by mouth daily before breakfast.   meclizine (ANTIVERT) 25 MG tablet Take 0.5-1 tablets (12.5-25 mg total) by mouth 3 (three) times daily as needed for dizziness.   nitroGLYCERIN (NITROSTAT) 0.4 MG SL tablet Place 1 tablet (0.4 mg total) under the tongue every 5 (five) minutes as needed for chest pain.   simvastatin (ZOCOR) 20 MG tablet TAKE 1 TABLET BY MOUTH EVERYDAY AT BEDTIME   valsartan (DIOVAN) 80 MG tablet Take 1 tablet (80 mg total) by mouth daily.   zolpidem (AMBIEN) 10 MG tablet Take 1 tablet (10 mg total) by mouth at bedtime as needed.    Review of  Systems    Review of Systems  Constitution: Negative for chills, fever and malaise/fatigue.  Cardiovascular: Negative for chest pain, dyspnea on exertion, irregular heartbeat, leg swelling, near-syncope, orthopnea, palpitations and syncope.  Respiratory: Negative for cough, shortness of breath and wheezing.   Gastrointestinal: Negative for melena, nausea and vomiting.  Genitourinary: Negative for hematuria.  Neurological: Negative for dizziness, light-headedness and weakness.   All other systems reviewed and are otherwise negative except as noted above.  Physical Exam    VS:  BP 124/78    Pulse 79    Ht 5\' 8"  (1.727 m)    Wt 197 lb 4 oz (89.5 kg)    SpO2 97%    BMI 29.99 kg/m  , BMI Body mass index is 29.99 kg/m. GEN: Well nourished, well developed, in no acute distress. HEENT: normal. Neck: Supple, no JVD, carotid bruits, or masses. Cardiac: RRR, no murmurs, rubs, or gallops. No clubbing, cyanosis, edema.  Radials/DP/PT 2+ and equal bilaterally.  Respiratory:  Respirations regular and unlabored, clear to auscultation bilaterally. GI: Soft, nontender, nondistended, BS + x 4. MS: No deformity or atrophy. Skin: Warm and dry, no rash. Neuro:  Strength and sensation are intact. Psych: Normal affect.  Assessment & Plan    1. Syncope - No recurrent lightheadedness, dizziness, near syncope,  nor syncope. Carotid duplex with left sided 1-39% stenosis with no stenosis noted to the right ICA.  Echo with normal LVEF and no significant valvular abnormalities.  Monitor with NSR and infrequent PVC.  Reports no palpitations.  Monitor had short burst of possible atrial fib/flutter versus artifact.  As he is completely asymptomatic more than likely artifact and no need for additional work-up at this time.  Likely noncardiac cause of syncope.  2. Vertigo - Continue Meclizine as directed by PCP.  Reports resolution of symptoms.  3. HTN -BP well controlled.  Continue present antihypertensive regimen.  BP goal less than 130/80.  Encouraged to purchase arm blood pressure cuff for monitoring and report blood pressure consistently more than 130/80.  4. HLD - 08/2019 LDL 78. Continue Simvastatin as directed by PCP.   5. OSA -CPAP compliance encouraged  6. Left-sided carotid artery stenosis -1-39% by duplex 04/20/2020.  Continue aspirin, statin.  7. PVC -noted on monitor.  Asymptomatic.  No indication for beta-blocker therapy at this time.  Encouraged to avoid caffeine, alcohol.  Disposition: Follow up in 6 month(s) with Dr. Saunders Revel or APP  Loel Dubonnet, NP 04/25/2020, 4:48 PM

## 2020-04-25 NOTE — Patient Instructions (Signed)
Medication Instructions:  No medication changes today.   *If you need a refill on your cardiac medications before your next appointment, please call your pharmacy*  Lab Work: None ordered today.   Testing/Procedures: Your carotid ultrasound showed no stenosis (stiffening) in your right carotid artery. In your left carotid artery there was 1-39% stenosis. This is treated and managed with your cholesterol medication and aspirin.   Your heart monitor showed normal sinus rhythm and an occasional PVC (premature ventricular contraction) - this is an early beat in the bottom chambers of your heart. This is very common and not of concern.   Your ultrasound of your heart showed normal heart pumping function and normal heart valves. This was a great result!  No signs of causes of your syncope.   Follow-Up: At [facility], you and your health needs are our priority.  As part of our continuing mission to provide you with exceptional heart care, we have created designated Provider Care Teams.  These Care Teams include your primary Cardiologist (physician) and Advanced Practice Providers (APPs -  Physician Assistants and Nurse Practitioners) who all work together to provide you with the care you need, when you need it.  We recommend signing up for the patient portal called "MyChart".  Sign up information is provided on this After Visit Summary.  MyChart is used to connect with patients for Virtual Visits (Telemedicine).  Patients are able to view lab/test results, encounter notes, upcoming appointments, etc.  Non-urgent messages can be sent to your provider as well.   To learn more about what you can do with MyChart, go to [email or web address].    Your next appointment:   6 month(s)  The format for your next appointment:   In Person  Provider:   You may see Nelva Bush, MD or one of the following Advanced Practice Providers on your designated Care Team:    Murray Hodgkins, NP  Christell Faith, PA-C  Marrianne Mood, PA-C  Laurann Montana, NP  Other Instructions   Recommend purchasing an arm blood pressure cuff. Omron is a good brand.   Tips to Measure your Blood Pressure Correctly  To determine whether you have hypertension, a medical professional will take a blood pressure reading. How you prepare for the test, the position of your arm, and other factors can change a blood pressure reading by 10% or more. That could be enough to hide high blood pressure, start you on a drug you don't really need, or lead your doctor to incorrectly adjust your medications.  National and international guidelines offer specific instructions for measuring blood pressure. If a doctor, nurse, or medical assistant isn't doing it right, don't hesitate to ask him or her to get with the guidelines.  Here's what you can do to ensure a correct reading: . Don't drink a caffeinated beverage or smoke during the 30 minutes before the test. . Sit quietly for five minutes before the test begins. . During the measurement, sit in a chair with your feet on the floor and your arm supported so your elbow is at about heart level. . The inflatable part of the cuff should completely cover at least 80% of your upper arm, and the cuff should be placed on bare skin, not over a shirt. . Don't talk during the measurement. . Have your blood pressure measured twice, with a brief break in between. If the readings are different by 5 points or more, have it done a third time.  There are times  to break these rules. If you sometimes feel lightheaded when getting out of bed in the morning or when you stand after sitting, you should have your blood pressure checked while seated and then while standing to see if it falls from one position to the next.  In 2017, new guidelines from the [employer], the [employer] of Cardiology, and nine other health organizations lowered the diagnosis of high blood pressure  to 130/80 mm Hg or higher for all adults. The guidelines also redefined the various blood pressure categories to now include normal, elevated, Stage 1 hypertension, Stage 2 hypertension, and hypertensive crisis (see "Blood pressure categories").  Blood pressure categories  Blood pressure category SYSTOLIC (upper number)  DIASTOLIC (lower number)  Normal Less than 120 mm Hg and Less than 80 mm Hg  Elevated 120-129 mm Hg and Less than 80 mm Hg  High blood pressure: Stage 1 hypertension 130-139 mm Hg or 80-89 mm Hg  High blood pressure: Stage 2 hypertension 140 mm Hg or higher or 90 mm Hg or higher  Hypertensive crisis (consult your doctor immediately) Higher than 180 mm Hg and/or Higher than 120 mm Hg  Source: American Heart Association and American Stroke Association. For more on getting your blood pressure under control, buy Controlling Your Blood Pressure, a Special Health Report from [facility].   Blood Pressure Log   Date   Time  Blood Pressure  Position  Example: Nov 1 9 AM 124/78 sitting                                                     Fat and Cholesterol Restricted Eating Plan Getting too much fat and cholesterol in your diet may cause health problems. Choosing the right foods helps keep your fat and cholesterol at normal levels. This can keep you from getting certain diseases. What are tips for following this plan? Meal planning  At meals, divide your plate into four equal parts: ? Fill one-half of your plate with vegetables and green salads. ? Fill one-fourth of your plate with whole grains. ? Fill one-fourth of your plate with low-fat (lean) protein foods.  Eat fish that is high in omega-3 fats at least two times a week. This includes mackerel, tuna, sardines, and salmon.  Eat foods that are high in fiber, such as whole grains, beans, apples, broccoli, carrots, peas, and barley. General tips   Work with your doctor to lose weight if  you need to.  Avoid: ? Foods with added sugar. ? Fried foods. ? Foods with partially hydrogenated oils.  Limit alcohol intake to no more than 1 drink a day for nonpregnant women and 2 drinks a day for men. One drink equals 12 oz of beer, 5 oz of wine, or 1 oz of hard liquor. Reading food labels  Check food labels for: ? Trans fats. ? Partially hydrogenated oils. ? Saturated fat (g) in each serving. ? Cholesterol (mg) in each serving. ? Fiber (g) in each serving.  Choose foods with healthy fats, such as: ? Monounsaturated fats. ? Polyunsaturated fats. ? Omega-3 fats.  Choose grain products that have whole grains. Look for the word "whole" as the first word in the ingredient list. Cooking  Cook foods using low-fat methods. These include baking, boiling, grilling, and broiling.  Eat more home-cooked foods.  Eat at restaurants and buffets less often.  Avoid cooking using saturated fats, such as butter, cream, palm oil, palm kernel oil, and coconut oil. Recommended foods  Fruits  All fresh, canned (in natural juice), or frozen fruits. Vegetables  Fresh or frozen vegetables (raw, steamed, roasted, or grilled). Green salads. Grains  Whole grains, such as whole wheat or whole grain breads, crackers, cereals, and pasta. Unsweetened oatmeal, bulgur, barley, quinoa, or brown rice. Corn or whole wheat flour tortillas. Meats and other protein foods  Ground beef (85% or leaner), grass-fed beef, or beef trimmed of fat. Skinless chicken or [place]. Ground chicken or [place]. Pork trimmed of fat. All fish and seafood. Egg whites. Dried beans, peas, or lentils. Unsalted nuts or seeds. Unsalted canned beans. Nut butters without added sugar or oil. Dairy  Low-fat or nonfat dairy products, such as skim or 1% milk, 2% or reduced-fat cheeses, low-fat and fat-free ricotta or cottage cheese, or plain low-fat and nonfat yogurt. Fats and oils  Tub margarine without trans fats. Light or  reduced-fat mayonnaise and salad dressings. Avocado. Olive, canola, sesame, or safflower oils. The items listed above may not be a complete list of foods and beverages you can eat. Contact a dietitian for more information. Foods to avoid Fruits  Canned fruit in heavy syrup. Fruit in cream or butter sauce. Fried fruit. Vegetables  Vegetables cooked in cheese, cream, or butter sauce. Fried vegetables. Grains  White bread. White pasta. White rice. Cornbread. Bagels, pastries, and croissants. Crackers and snack foods that contain trans fat and hydrogenated oils. Meats and other protein foods  Fatty cuts of meat. Ribs, chicken wings, bacon, sausage, bologna, salami, chitterlings, fatback, hot dogs, bratwurst, and packaged lunch meats. Liver and organ meats. Whole eggs and egg yolks. Chicken and [place] with skin. Fried meat. Dairy  Whole or 2% milk, cream, half-and-half, and cream cheese. Whole milk cheeses. Whole-fat or sweetened yogurt. Full-fat cheeses. Nondairy creamers and whipped toppings. Processed cheese, cheese spreads, and cheese curds. Beverages  Alcohol. Sugar-sweetened drinks such as sodas, lemonade, and fruit drinks. Fats and oils  Butter, stick margarine, lard, shortening, ghee, or bacon fat. Coconut, palm kernel, and palm oils. Sweets and desserts  Corn syrup, sugars, honey, and molasses. Candy. Jam and jelly. Syrup. Sweetened cereals. Cookies, pies, cakes, donuts, muffins, and ice cream. The items listed above may not be a complete list of foods and beverages you should avoid. Contact a dietitian for more information. Summary  Choosing the right foods helps keep your fat and cholesterol at normal levels. This can keep you from getting certain diseases.  At meals, fill one-half of your plate with vegetables and green salads.  Eat high-fiber foods, like whole grains, beans, apples, carrots, peas, and barley.  Limit added sugar, saturated fats, alcohol, and fried  foods. This information is not intended to replace advice given to you by your health care provider. Make sure you discuss any questions you have with your health care provider. Document Revised: 07/07/2018 Document Reviewed: 07/21/2017 Elsevier Patient Education  [place].

## 2020-09-09 IMAGING — CT CT HEAD W/O CM
4 series · 16 of 47 positions shown, 18 images · non-contrast
Comparison: None.

CLINICAL DATA: Syncopal episode. Fell. Hit head.

EXAM:
CT HEAD WITHOUT CONTRAST
TECHNIQUE: Contiguous axial images were obtained from the base of the skull
through the vertex without intravenous contrast.

[Series 2: head wo · axial · 0.43mm/px · z∈[+399,+514]mm · 7 of 31 slices shown, 9 images]
[im 4/31  brain]
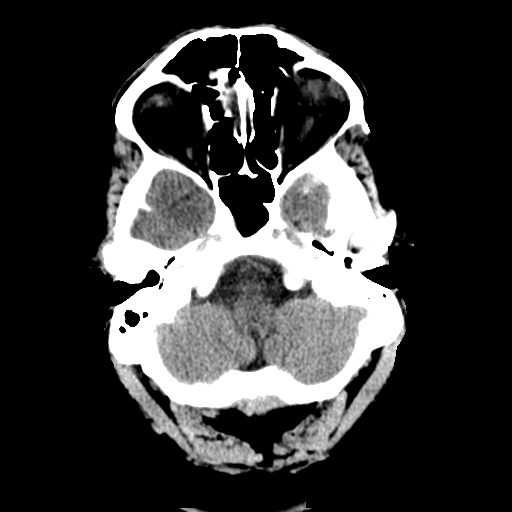
[im 4/31  bone]
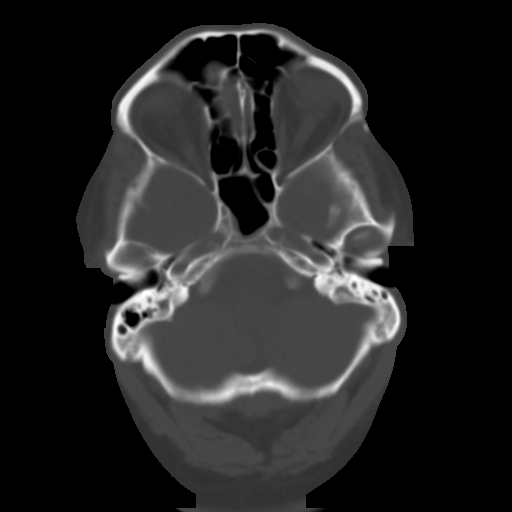
[im 8/31  brain]
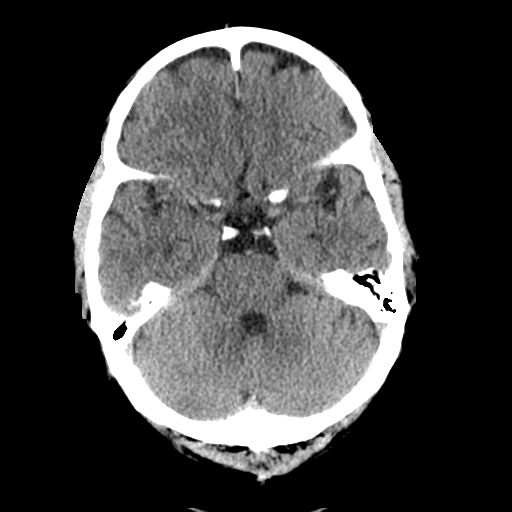
[im 12/31  brain]
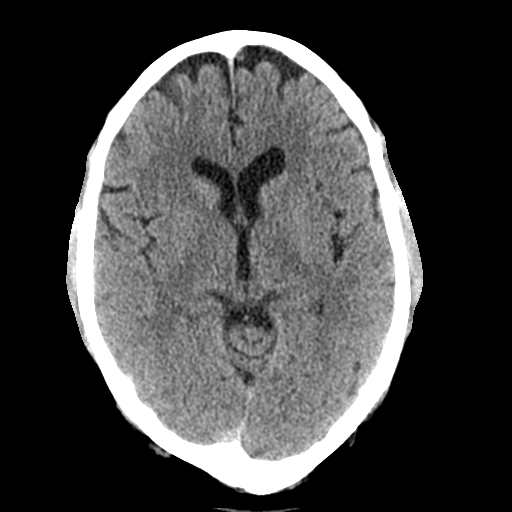
[im 16/31  brain]
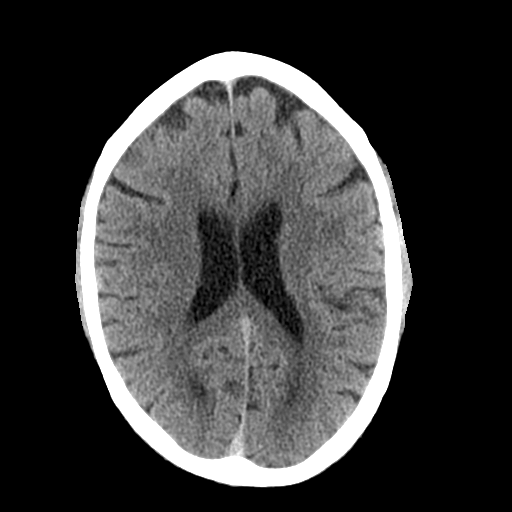
[im 19/31  brain]
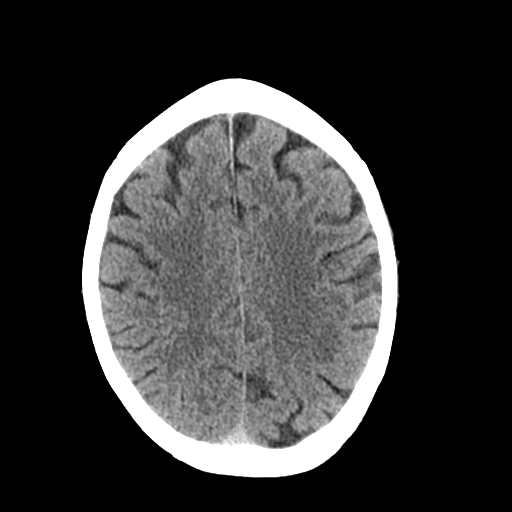
[im 19/31  bone]
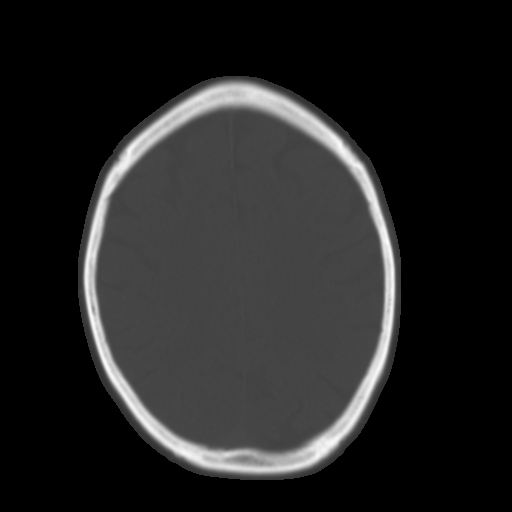
[im 23/31  brain]
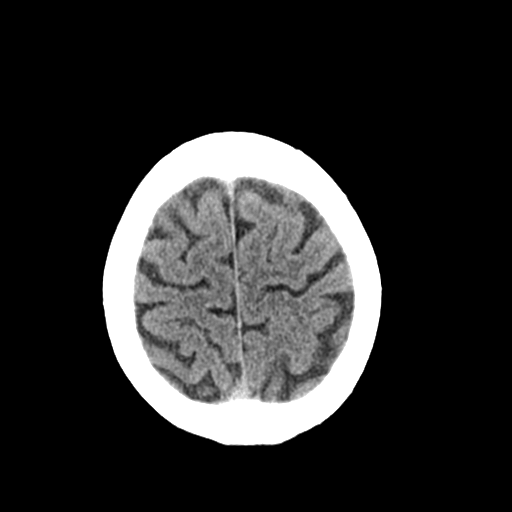
[im 27/31  brain]
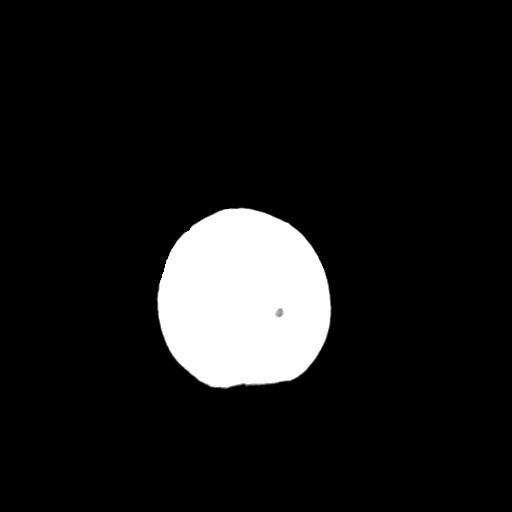

[Series 3: head bone · axial · 0.43mm/px · z∈[+398,+428]mm · 3 of 77 slices shown]
[im 8/77  bone]
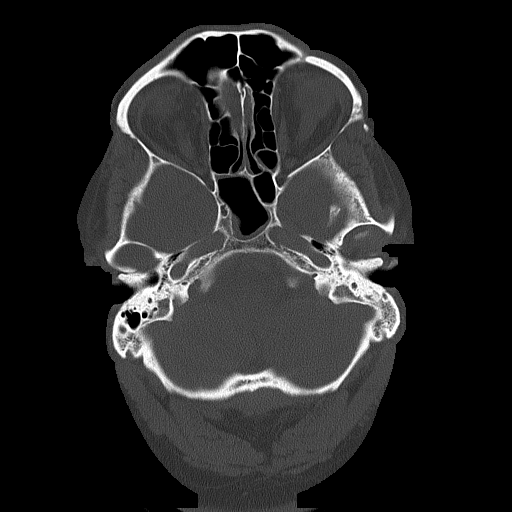
[im 16/77  bone]
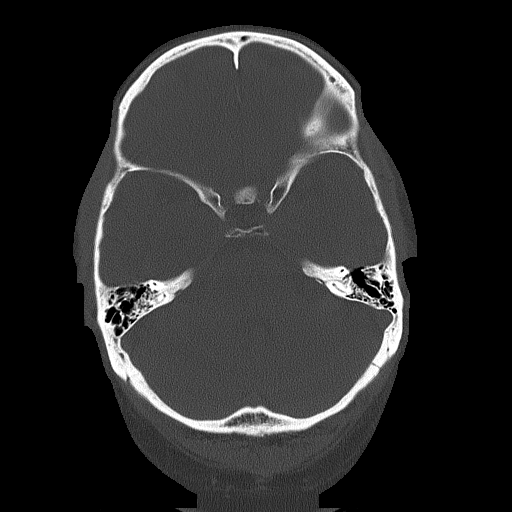
[im 23/77  bone]
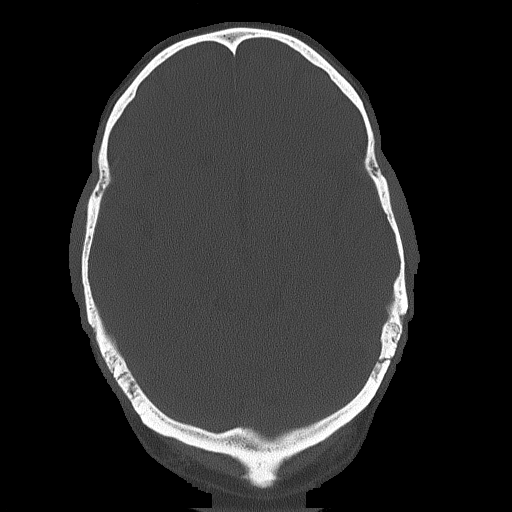

[Series 4: coronal soft tissue · coronal · 0.33mm/px · 3 of 70 slices shown]
[im 24/70  brain]
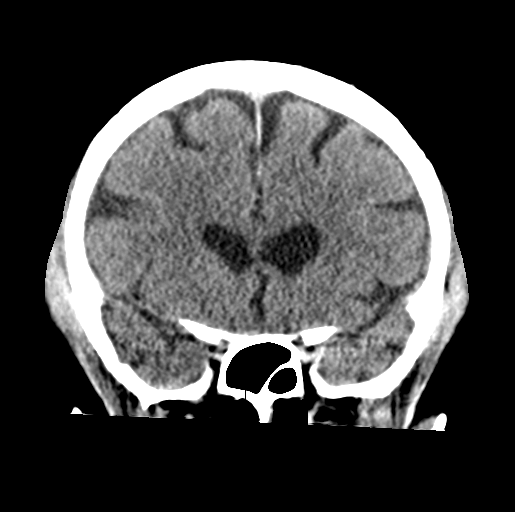
[im 31/70  brain]
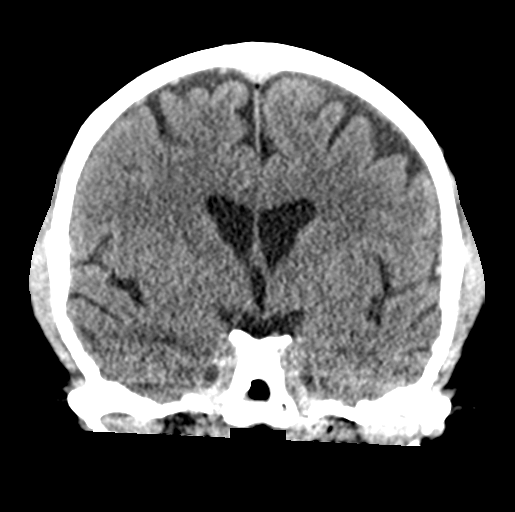
[im 39/70  brain]
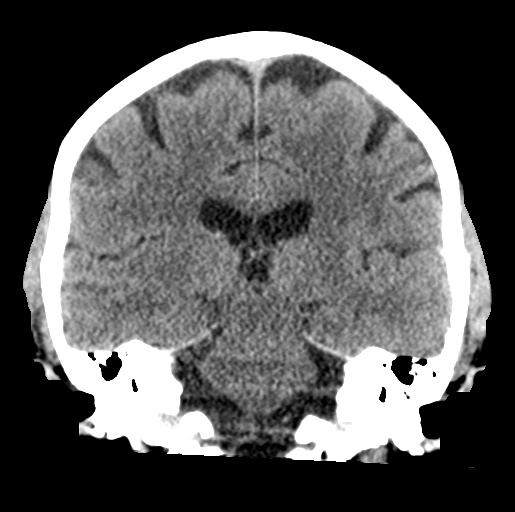

[Series 5: sagittal soft tissue · sagittal · 0.31mm/px · 3 of 59 slices shown]
[im 20/59  brain]
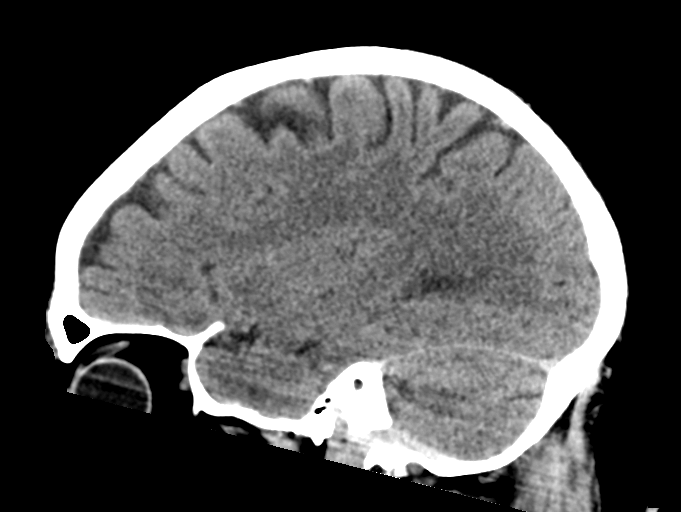
[im 30/59  brain]
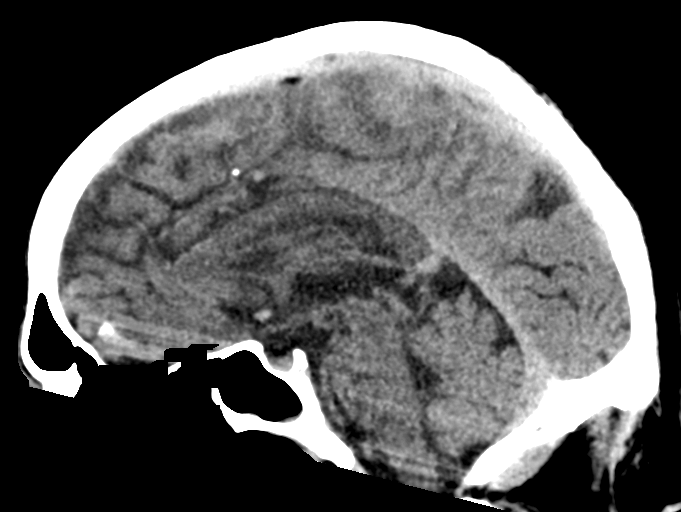
[im 39/59  brain]
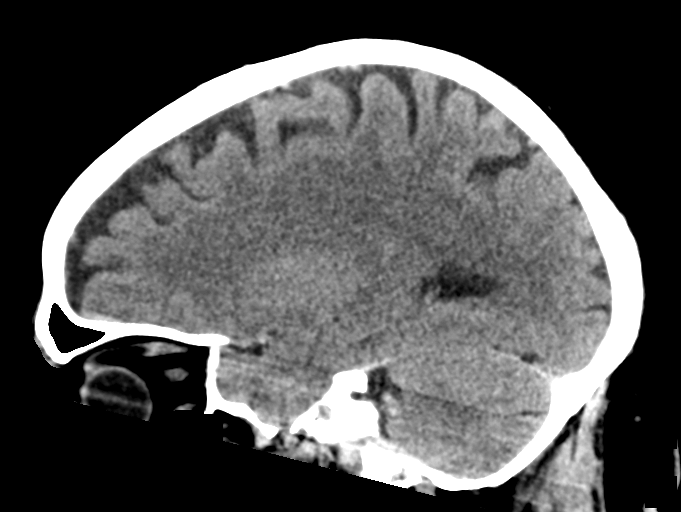

[16 of 47 positions shown; findings below may reference images not displayed]

FINDINGS: Brain: The ventricles are normal in size and configuration. No
extra-axial fluid collections are identified. The gray-white
differentiation is maintained. No CT findings for acute hemispheric
infarction or intracranial hemorrhage. No mass lesions. The
brainstem and cerebellum are normal.

Vascular: No hyperdense vessels or obvious aneurysm.

Skull: No acute skull fracture. No bone lesion.

Sinuses/Orbits: The paranasal sinuses and mastoid air cells are
clear. The globes are intact.

Other: Soft tissue swelling noted in the left orbital area.
IMPRESSION: No acute intracranial findings or skull fracture.

## 2020-09-29 ENCOUNTER — Other Ambulatory Visit: Payer: Self-pay | Admitting: Family Medicine

## 2020-10-01 NOTE — Telephone Encounter (Signed)
Pharmacy requests refill on: Levothyroxine 112 mcg  LAST REFILL: 06/21/2020 LAST OV: 12/05/2019 NEXT OV: Not Scheduled PHARMACY: [id number] [place], [place]   Last TSH (09/09/2019): 0.59

## 2020-10-02 NOTE — Telephone Encounter (Signed)
Sent. Thanks.  Please schedule CPE when possible.

## 2020-10-31 ENCOUNTER — Encounter: Payer: Self-pay | Admitting: Internal Medicine

## 2020-10-31 ENCOUNTER — Ambulatory Visit (INDEPENDENT_AMBULATORY_CARE_PROVIDER_SITE_OTHER): Payer: No Typology Code available for payment source | Admitting: Internal Medicine

## 2020-10-31 ENCOUNTER — Other Ambulatory Visit: Payer: Self-pay

## 2020-10-31 VITALS — BP 128/78 | HR 76 | Ht 67.0 in | Wt 203.1 lb

## 2020-10-31 DIAGNOSIS — R55 Syncope and collapse: Secondary | ICD-10-CM

## 2020-10-31 DIAGNOSIS — E785 Hyperlipidemia, unspecified: Secondary | ICD-10-CM | POA: Diagnosis not present

## 2020-10-31 DIAGNOSIS — I10 Essential (primary) hypertension: Secondary | ICD-10-CM | POA: Diagnosis not present

## 2020-10-31 NOTE — Patient Instructions (Addendum)
Medication Instructions:  No changes  *If you need a refill on your cardiac medications before your next appointment, please call your pharmacy*   Lab Work: None  If you have labs (blood work) drawn today and your tests are completely normal, you will receive your results only by: Marland Kitchen MyChart Message (if you have MyChart) OR . A paper copy in the mail If you have any lab test that is abnormal or we need to change your treatment, we will call you to review the results.   Testing/Procedures: None   Follow-Up: At [facility], you and your health needs are our priority.  As part of our continuing mission to provide you with exceptional heart care, we have created designated Provider Care Teams.  These Care Teams include your primary Cardiologist (physician) and Advanced Practice Providers (APPs -  Physician Assistants and Nurse Practitioners) who all work together to provide you with the care you need, when you need it.    Your next appointment:   1 year(s)  The format for your next appointment:   In Person  Provider:   You may see Nelva Bush, MD or one of the following Advanced Practice Providers on your designated Care Team:    Murray Hodgkins, NP  Christell Faith, PA-C  Marrianne Mood, PA-C  Cadence [place], [place]  Laurann Montana, NP

## 2020-10-31 NOTE — Progress Notes (Signed)
Follow-up Outpatient Visit Date: 10/31/2020  Primary Care Provider: Tonia Ghent, MD [place] [address]  Chief Complaint: Follow-up syncope  HPI:  Micheal Jones is a 64 y.o. male with history of syncope, hypertension, hyperlipidemia, vertigo, and depression, who presents for follow-up of syncope.  He was last seen in our office by Laurann Montana, NP, in early June.  He was feeling well at that time with only one episode of brief dizziness in the setting of having not eaten anything all day.  He had not fallen or passed out.  Today, Micheal Jones reports that he has been feeling well without chest pain, shortness of breath, palpitations, or lightheadedness.  He has not had any further syncope.  His only complaint is of frequent GERD at night, which he attributes to having a late dinner.  The symptoms are controlled with as needed Tums.  --------------------------------------------------------------------------------------------------  Past Medical History:  Diagnosis Date  . Abdominal pain    left lower abd pain/ on and off for 2 years  . Achilles tendon tear    right, partial, no surgery  . Anxiety   . Arthritis    C and L spine  . Depression   . GERD (gastroesophageal reflux disease)   . Hyperlipidemia   . Hypertension   . Hypothyroidism   . Insomnia   . Renal stones   . Sleep apnea    occ use of CPAP, trouble with mask fit   Past Surgical History:  Procedure Laterality Date  . COLONOSCOPY    . LITHOTRIPSY     x 2  . THYROIDECTOMY     partial  . TONSILLECTOMY      Current Meds  Medication Sig  . aspirin 81 MG tablet Take 81 mg by mouth daily.  . citalopram (CELEXA) 20 MG tablet Take 1 tablet (20 mg total) by mouth daily.  . diclofenac (VOLTAREN) 75 MG EC tablet TAKE 1 TABLET BY MOUTH TWICE A DAY WITH FOOD  . meclizine (ANTIVERT) 25 MG tablet Take 0.5-1 tablets (12.5-25 mg total) by mouth 3 (three) times daily as needed for dizziness.  .  nitroGLYCERIN (NITROSTAT) 0.4 MG SL tablet Place 1 tablet (0.4 mg total) under the tongue every 5 (five) minutes as needed for chest pain.  . simvastatin (ZOCOR) 20 MG tablet TAKE 1 TABLET BY MOUTH EVERYDAY AT BEDTIME  . SYNTHROID 112 MCG tablet TAKE 1 TABLET (112 MCG TOTAL) BY MOUTH DAILY BEFORE BREAKFAST.  . valsartan (DIOVAN) 80 MG tablet Take 1 tablet (80 mg total) by mouth daily.  Marland Kitchen zolpidem (AMBIEN) 10 MG tablet Take 1 tablet (10 mg total) by mouth at bedtime as needed.    Allergies: Patient has no known allergies.  Social History   Tobacco Use  . Smoking status: Never Smoker  . Smokeless tobacco: Never Used  Vaping Use  . Vaping Use: Never used  Substance Use Topics  . Alcohol use: Yes    Alcohol/week: 2.0 - 3.0 standard drinks    Types: 2 - 3 Cans of beer per week    Comment: with golf  . Drug use: No    Family History  Problem Relation Age of Onset  . Stroke Mother   . Coronary artery disease Mother   . Suicidality Father   . Coronary artery disease Brother   . Heart disease Brother   . Healthy Sister   . Colon cancer Neg Hx   . Prostate cancer Neg Hx  Review of Systems: A 12-system review of systems was performed and was negative except as noted in the HPI.  --------------------------------------------------------------------------------------------------  Physical Exam: BP 128/78 (BP Location: Left Arm, Patient Position: Sitting, Cuff Size: Normal)   Pulse 76   Ht 5\' 7"  (1.702 m)   Wt 203 lb 2 oz (92.1 kg)   SpO2 98%   BMI 31.81 kg/m   General:  NAD. Neck: No JVD or HJR. Lungs: CTA bilaterally. Heart: RRR w/o m/r/g. Abd: Soft, NT/ND. Ext: No LE edema.  EKG:  NSR without abnormality.  Lab Results  Component Value Date   WBC 8.1 01/27/2020   HGB 16.2 01/27/2020   HCT 48.4 01/27/2020   MCV 94.2 01/27/2020   PLT 217 01/27/2020    Lab Results  Component Value Date   NA 138 01/27/2020   K 4.6 01/27/2020   CL 105 01/27/2020   CO2 28  01/27/2020   BUN 15 01/27/2020   CREATININE 0.96 01/27/2020   GLUCOSE 103 (H) 01/27/2020   ALT 42 09/09/2019    Lab Results  Component Value Date   CHOL 137 09/09/2019   HDL 30.00 (L) 09/09/2019   LDLCALC 78 09/09/2019   LDLDIRECT 172.1 07/28/2013   TRIG 145.0 09/09/2019   CHOLHDL 5 09/09/2019    --------------------------------------------------------------------------------------------------  ASSESSMENT AND PLAN: Syncope: No further episodes since the spring.  Workup was largely unrevealing other than mild left carotid artery plaquing and possible arrhythmia (favored to be artifact rather than atrial fibrillation).  No further workup is indicated at this time.  Hypertension: BP well-controlled.  Continue current medications.  Hyperlipidemia: LDL well-controlled.  Continue simvastatin.  Follow-up: Return to clinic in 1 year.  Micheal Bush, MD 10/31/2020 1:31 PM

## 2020-11-01 ENCOUNTER — Encounter: Payer: Self-pay | Admitting: Internal Medicine

## 2020-11-08 NOTE — Telephone Encounter (Signed)
Going to call back once he is back at work to look at schedule

## 2020-11-24 ENCOUNTER — Other Ambulatory Visit: Payer: Self-pay | Admitting: Family Medicine

## 2020-12-19 ENCOUNTER — Other Ambulatory Visit: Payer: Self-pay | Admitting: Family Medicine

## 2020-12-19 DIAGNOSIS — I10 Essential (primary) hypertension: Secondary | ICD-10-CM

## 2020-12-19 DIAGNOSIS — Z125 Encounter for screening for malignant neoplasm of prostate: Secondary | ICD-10-CM

## 2020-12-27 ENCOUNTER — Other Ambulatory Visit: Payer: Self-pay | Admitting: Family Medicine

## 2020-12-28 ENCOUNTER — Other Ambulatory Visit (INDEPENDENT_AMBULATORY_CARE_PROVIDER_SITE_OTHER): Payer: No Typology Code available for payment source

## 2020-12-28 ENCOUNTER — Other Ambulatory Visit: Payer: Self-pay

## 2020-12-28 DIAGNOSIS — Z125 Encounter for screening for malignant neoplasm of prostate: Secondary | ICD-10-CM

## 2020-12-28 DIAGNOSIS — I10 Essential (primary) hypertension: Secondary | ICD-10-CM

## 2020-12-28 LAB — COMPREHENSIVE METABOLIC PANEL
ALT: 48 U/L (ref 0–53)
AST: 25 U/L (ref 0–37)
Albumin: 4.3 g/dL (ref 3.5–5.2)
Alkaline Phosphatase: 64 U/L (ref 39–117)
BUN: 17 mg/dL (ref 6–23)
CO2: 31 mEq/L (ref 19–32)
Calcium: 9.4 mg/dL (ref 8.4–10.5)
Chloride: 101 mEq/L (ref 96–112)
Creatinine, Ser: 1.03 mg/dL (ref 0.40–1.50)
GFR: 77.25 mL/min (ref >60.00)
Glucose, Bld: 87 mg/dL (ref 70–99)
Potassium: 4.3 mEq/L (ref 3.5–5.1)
Sodium: 139 mEq/L (ref 135–145)
Total Bilirubin: 1.1 mg/dL (ref 0.2–1.2)
Total Protein: 7.2 g/dL (ref 6.0–8.3)

## 2020-12-28 LAB — LIPID PANEL
Cholesterol: 197 mg/dL (ref 0–200)
HDL: 39.9 mg/dL (ref >39.00)
LDL Cholesterol: 132 mg/dL — ABNORMAL HIGH (ref 0–99)
NonHDL: 156.84
Total CHOL/HDL Ratio: 5
Triglycerides: 123 mg/dL (ref 0.0–149.0)
VLDL: 24.6 mg/dL (ref 0.0–40.0)

## 2020-12-28 LAB — PSA: PSA: 1.66 ng/mL (ref 0.10–4.00)

## 2020-12-28 LAB — TSH: TSH: 5.39 u[IU]/mL — ABNORMAL HIGH (ref 0.35–4.50)

## 2021-01-03 ENCOUNTER — Ambulatory Visit (INDEPENDENT_AMBULATORY_CARE_PROVIDER_SITE_OTHER): Payer: No Typology Code available for payment source | Admitting: Family Medicine

## 2021-01-03 ENCOUNTER — Encounter: Payer: Self-pay | Admitting: Family Medicine

## 2021-01-03 ENCOUNTER — Other Ambulatory Visit: Payer: Self-pay

## 2021-01-03 VITALS — BP 122/68 | HR 86 | Temp 97.5°F | Ht 67.0 in | Wt 205.0 lb

## 2021-01-03 DIAGNOSIS — F419 Anxiety disorder, unspecified: Secondary | ICD-10-CM

## 2021-01-03 DIAGNOSIS — E785 Hyperlipidemia, unspecified: Secondary | ICD-10-CM

## 2021-01-03 DIAGNOSIS — R1012 Left upper quadrant pain: Secondary | ICD-10-CM

## 2021-01-03 DIAGNOSIS — Z7189 Other specified counseling: Secondary | ICD-10-CM

## 2021-01-03 DIAGNOSIS — I10 Essential (primary) hypertension: Secondary | ICD-10-CM

## 2021-01-03 DIAGNOSIS — E039 Hypothyroidism, unspecified: Secondary | ICD-10-CM

## 2021-01-03 DIAGNOSIS — Z0000 Encounter for general adult medical examination without abnormal findings: Secondary | ICD-10-CM

## 2021-01-03 DIAGNOSIS — G4700 Insomnia, unspecified: Secondary | ICD-10-CM

## 2021-01-03 MED ORDER — CITALOPRAM HYDROBROMIDE 20 MG PO TABS: 20.0000 mg | ORAL_TABLET | Freq: Every day | ORAL | 3 refills | Status: DC

## 2021-01-03 MED ORDER — DICLOFENAC SODIUM 75 MG PO TBEC: DELAYED_RELEASE_TABLET | ORAL | 3 refills | Status: DC

## 2021-01-03 MED ORDER — MECLIZINE HCL 25 MG PO TABS: 12.5000 mg | ORAL_TABLET | Freq: Three times a day (TID) | ORAL | 1 refills | Status: AC | PRN

## 2021-01-03 MED ORDER — VALSARTAN 80 MG PO TABS: 80.0000 mg | ORAL_TABLET | Freq: Every day | ORAL | 3 refills | Status: DC

## 2021-01-03 MED ORDER — LEVOTHYROXINE SODIUM 112 MCG PO TABS: ORAL_TABLET | ORAL | 3 refills | Status: DC

## 2021-01-03 MED ORDER — SIMVASTATIN 20 MG PO TABS: ORAL_TABLET | ORAL | 3 refills | Status: DC

## 2021-01-03 NOTE — Progress Notes (Signed)
This visit occurred during the SARS-CoV-2 public health emergency.  Safety protocols were in place, including screening questions prior to the visit, additional usage of staff PPE, and extensive cleaning of exam room while observing appropriate contact time as indicated for disinfecting solutions.  CPE- See plan.  Routine anticipatory guidance given to patient.  See health maintenance.  The possibility exists that previously documented standard health maintenance information may have been brought forward from a previous encounter into this note.  If needed, that same information has been updated to reflect the current situation based on today's encounter.    Tetanus 2013 PNA due at 66 Shingles shot d/w pt. see avs.   Flu shot d/w pt.  Covid vaccine 2021 Colonoscopy 2020 PSA wnl.  Nocturia noted. Living will d/w pt. Would have his sister Alan Ripper designated if patient were incapacitated. Diet and exercise d/w pt. Encouraged both HCV and HIV screening neg 2018.  nsaid caution d/w pt.    He is dating again.  D/w pt.    Hypothyroidism.  TSH up.  Still on levothyroxine.  No ADE on med.  No neck mass or lump.  No dysphagia.  Hypertension:    Using medication without problems or lightheadedness: yes Chest pain with exertion:no Edema:no Short of breath:no  Elevated Cholesterol: Using medications without problems: yes Muscle aches: no Diet compliance: encouraged.   Exercise: encouraged  Mood is good.  Compliant with citalopram.  Doing well.  Job change helped.  He still has occ flare with L sided flank pain, has been going on for years w/o ominous pathology/findings.  Still unpredictable in terms of onset but manageable.  Discussed previous work-up.  I presume that he has colonic spasm/a functional issue that is not an obvious anatomic issue, meaning it would not show up on the typical imaging such as CT scan.  The problem is that when he does have symptoms, they resolve before any  abortive medication would likely be useful.  Discussed.  He agrees.  Insomnia.  Rare use of ambien.  No ADE on med.  He is working from home and his job situation is clearly better.    His daughter is going to [employer] this upcoming fall.    PMH and SH reviewed  Meds, vitals, and allergies reviewed.   ROS: Per HPI.  Unless specifically indicated otherwise in HPI, the patient denies:  General: fever. Eyes: acute vision changes ENT: sore throat Cardiovascular: chest pain Respiratory: SOB GI: vomiting GU: dysuria Musculoskeletal: acute back pain Derm: acute rash Neuro: acute motor dysfunction Psych: worsening mood Endocrine: polydipsia Heme: bleeding Allergy: hayfever  GEN: nad, alert and oriented HEENT: ncat NECK: supple w/o LA, no thyromegaly. CV: rrr. PULM: ctab, no inc wob ABD: soft, +bs EXT: no edema SKIN: no acute rash

## 2021-01-03 NOTE — Patient Instructions (Addendum)
Check with your insurance to see if they will cover the shingles shot. I would get a flu shot each fall.    Nonfasting labs in about 2 months.  We'll go from there.   Take care.  Glad to see you.

## 2021-01-06 NOTE — Assessment & Plan Note (Signed)
Tetanus 2013 PNA due at 66 Shingles shot d/w pt. see avs.   Flu shot d/w pt.  Covid vaccine 2021 Colonoscopy 2020 PSA wnl.  Nocturia noted. Living will d/w pt. Would have his sister Alan Ripper designated if patient were incapacitated. Diet and exercise d/w pt. Encouraged both HCV and HIV screening neg 2018.

## 2021-01-06 NOTE — Assessment & Plan Note (Signed)
Controlled.  Continue citalopram.

## 2021-01-06 NOTE — Assessment & Plan Note (Signed)
Living will d/w pt.  Would have his sister Micheal Jones designated if patient were incapacitated. ?

## 2021-01-06 NOTE — Assessment & Plan Note (Signed)
Improved with only rare use of Ambien.  His work change clearly improved his situation.

## 2021-01-06 NOTE — Assessment & Plan Note (Signed)
Minimal TSH elevation.  We can recheck in a few months.  See after visit summary.  He agrees.

## 2021-01-06 NOTE — Assessment & Plan Note (Signed)
He still has occ flare with L sided flank pain, has been going on for years w/o ominous pathology/findings.  Still unpredictable in terms of onset but manageable.  Discussed previous work-up.  I presume that he has colonic spasm/a functional issue that is not an obvious anatomic issue, meaning it would not show up on the typical imaging such as CT scan.  The problem is that when he does have symptoms, they resolve before any abortive medication would likely be useful.  Discussed.  He agrees.

## 2021-01-06 NOTE — Assessment & Plan Note (Signed)
Continue simvastatin.  Continue work on diet and exercise.  Labs discussed with patient.

## 2021-01-06 NOTE — Assessment & Plan Note (Signed)
Continue valsartan.  Continue work on diet and exercise. ?

## 2021-03-04 ENCOUNTER — Other Ambulatory Visit (INDEPENDENT_AMBULATORY_CARE_PROVIDER_SITE_OTHER): Payer: No Typology Code available for payment source

## 2021-03-04 ENCOUNTER — Other Ambulatory Visit: Payer: Self-pay

## 2021-03-04 DIAGNOSIS — E039 Hypothyroidism, unspecified: Secondary | ICD-10-CM

## 2021-03-04 LAB — TSH: TSH: 5.16 u[IU]/mL — ABNORMAL HIGH (ref 0.35–4.50)

## 2021-03-05 ENCOUNTER — Other Ambulatory Visit: Payer: Self-pay | Admitting: Family Medicine

## 2021-03-05 DIAGNOSIS — E039 Hypothyroidism, unspecified: Secondary | ICD-10-CM

## 2021-09-24 ENCOUNTER — Other Ambulatory Visit: Payer: Self-pay | Admitting: Family Medicine

## 2022-01-15 ENCOUNTER — Other Ambulatory Visit: Payer: Self-pay | Admitting: Family Medicine

## 2022-01-15 NOTE — Telephone Encounter (Signed)
Refill request for Voltaren 75 mc tablet  ? ?LOV - 01/03/21 ?Next OV - not scheduled ?Last refill - 01/03/21 #180/3 ? ?

## 2022-01-15 NOTE — Telephone Encounter (Signed)
Sent.  Please schedule yearly visit when possible.  Thanks.  ?

## 2022-01-21 ENCOUNTER — Other Ambulatory Visit: Payer: Self-pay | Admitting: Family Medicine

## 2022-01-22 NOTE — Telephone Encounter (Signed)
[place] [facility] - Client ?Nonclinical Telephone Record  ?AccessNurse? ?Client [place] [facility] - Client ?Client Site [employer] - Day ?Contact Type Call ?Who Is Calling Patient / Member / Family / Caregiver ?Caller Name Aulden Calise ?Caller Phone Number [phone] ?Patient Name Micheal Jones ?Patient DOB 22-Jun-2056 ?Call Type Message Only Information Provided ?Reason for Call Request to Schedule Office Appointment ?Initial Comment Caller states that he would like to schedule an appt. ?Disp. Time Disposition Final User ?01/22/2022 1:24:23 PM General Information Provided Yes Janith Lima ?Call Closed By: Janith Lima ?Transaction Date/Time: 01/22/2022 1:19:24 PM (ET ?

## 2022-01-22 NOTE — Telephone Encounter (Signed)
Patient due to CPE; please call to schedule. ?

## 2022-01-22 NOTE — Telephone Encounter (Signed)
Refill request for Simvastatin and Synthroid  ? ?LOV - 01/03/21 ?Next OV - not scheduled yet ?Last refill - 01/03/21 #90/3 for both rxs ? ?

## 2022-01-22 NOTE — Telephone Encounter (Signed)
Rx sent, please schedule.  Thanks.  ?

## 2022-02-09 ENCOUNTER — Other Ambulatory Visit: Payer: Self-pay | Admitting: Family Medicine

## 2022-02-09 DIAGNOSIS — Z125 Encounter for screening for malignant neoplasm of prostate: Secondary | ICD-10-CM

## 2022-02-09 DIAGNOSIS — I10 Essential (primary) hypertension: Secondary | ICD-10-CM

## 2022-02-09 DIAGNOSIS — E039 Hypothyroidism, unspecified: Secondary | ICD-10-CM

## 2022-02-13 ENCOUNTER — Other Ambulatory Visit: Payer: No Typology Code available for payment source

## 2022-02-13 NOTE — Telephone Encounter (Signed)
Appts were made for 02/2022 ?

## 2022-02-21 ENCOUNTER — Encounter: Payer: No Typology Code available for payment source | Admitting: Family Medicine

## 2022-03-04 ENCOUNTER — Other Ambulatory Visit (INDEPENDENT_AMBULATORY_CARE_PROVIDER_SITE_OTHER): Payer: Self-pay

## 2022-03-04 DIAGNOSIS — I10 Essential (primary) hypertension: Secondary | ICD-10-CM

## 2022-03-04 DIAGNOSIS — Z125 Encounter for screening for malignant neoplasm of prostate: Secondary | ICD-10-CM

## 2022-03-04 DIAGNOSIS — E039 Hypothyroidism, unspecified: Secondary | ICD-10-CM

## 2022-03-04 LAB — COMPREHENSIVE METABOLIC PANEL
ALT: 33 U/L (ref 0–53)
AST: 23 U/L (ref 0–37)
Albumin: 4.3 g/dL (ref 3.5–5.2)
Alkaline Phosphatase: 71 U/L (ref 39–117)
BUN: 17 mg/dL (ref 6–23)
CO2: 29 mEq/L (ref 19–32)
Calcium: 9 mg/dL (ref 8.4–10.5)
Chloride: 102 mEq/L (ref 96–112)
Creatinine, Ser: 1.01 mg/dL (ref 0.40–1.50)
GFR: 78.43 mL/min (ref >60.00)
Glucose, Bld: 92 mg/dL (ref 70–99)
Potassium: 4.2 mEq/L (ref 3.5–5.1)
Sodium: 139 mEq/L (ref 135–145)
Total Bilirubin: 1.1 mg/dL (ref 0.2–1.2)
Total Protein: 7 g/dL (ref 6.0–8.3)

## 2022-03-04 LAB — LIPID PANEL
Cholesterol: 144 mg/dL (ref 0–200)
HDL: 37 mg/dL — ABNORMAL LOW (ref >39.00)
LDL Cholesterol: 89 mg/dL (ref 0–99)
NonHDL: 106.94
Total CHOL/HDL Ratio: 4
Triglycerides: 91 mg/dL (ref 0.0–149.0)
VLDL: 18.2 mg/dL (ref 0.0–40.0)

## 2022-03-04 LAB — PSA: PSA: 1.34 ng/mL (ref 0.10–4.00)

## 2022-03-04 LAB — TSH: TSH: 2.42 u[IU]/mL (ref 0.35–5.50)

## 2022-03-13 ENCOUNTER — Encounter: Payer: Self-pay | Admitting: Family Medicine

## 2022-03-13 ENCOUNTER — Ambulatory Visit (INDEPENDENT_AMBULATORY_CARE_PROVIDER_SITE_OTHER): Payer: No Typology Code available for payment source | Admitting: Family Medicine

## 2022-03-13 VITALS — BP 130/80 | HR 72 | Temp 97.4°F | Ht 67.0 in | Wt 196.0 lb

## 2022-03-13 DIAGNOSIS — Z0000 Encounter for general adult medical examination without abnormal findings: Secondary | ICD-10-CM

## 2022-03-13 DIAGNOSIS — G4700 Insomnia, unspecified: Secondary | ICD-10-CM

## 2022-03-13 DIAGNOSIS — E785 Hyperlipidemia, unspecified: Secondary | ICD-10-CM

## 2022-03-13 DIAGNOSIS — F419 Anxiety disorder, unspecified: Secondary | ICD-10-CM

## 2022-03-13 DIAGNOSIS — I10 Essential (primary) hypertension: Secondary | ICD-10-CM

## 2022-03-13 DIAGNOSIS — Z7189 Other specified counseling: Secondary | ICD-10-CM

## 2022-03-13 DIAGNOSIS — E039 Hypothyroidism, unspecified: Secondary | ICD-10-CM

## 2022-03-13 DIAGNOSIS — R1012 Left upper quadrant pain: Secondary | ICD-10-CM

## 2022-03-13 MED ORDER — CITALOPRAM HYDROBROMIDE 20 MG PO TABS
20.0000 mg | ORAL_TABLET | Freq: Every day | ORAL | 3 refills | Status: DC
Start: 2022-03-13 — End: 2023-03-17

## 2022-03-13 MED ORDER — ZOLPIDEM TARTRATE 10 MG PO TABS: 5.0000 mg | ORAL_TABLET | Freq: Every evening | ORAL | 1 refills | Status: DC | PRN

## 2022-03-13 MED ORDER — VALSARTAN 80 MG PO TABS: 80.0000 mg | ORAL_TABLET | Freq: Every day | ORAL | 3 refills | Status: DC

## 2022-03-13 NOTE — Patient Instructions (Addendum)
Stop the simvastatin for about two weeks and see if the cramping gets better.  Either way, let me know.  ?Take care.  Glad to see you. ?Let me know what brand of thyroid medicine will be cheaper.   ?

## 2022-03-13 NOTE — Progress Notes (Signed)
CPE- See plan.  Routine anticipatory guidance given to patient.  See health maintenance.  The possibility exists that previously documented standard health maintenance information may have been brought forward from a previous encounter into this note.  If needed, that same information has been updated to reflect the current situation based on today's encounter.   ? ?Tetanus 2013 ?PNA due at 66 ?Shingles shot d/w pt.  ?Flu shot d/w pt.  ?Covid vaccine 2021 ?Colonoscopy 2020 ?PSA wnl. ?Living will d/w pt.  Would have his sister Alan Ripper designated if patient were incapacitated. ?Diet and exercise d/w pt.  Encouraged both ?HCV and HIV screening neg 2018. ?  ?Nsaid caution d/w pt.   ?  ?He is dating again.  D/w pt.   ?  ?Hypothyroidism.  TSH wnl.  Still on levothyroxine.  No ADE on med.  No neck mass or lump.  No dysphagia. ?  ?Hypertension:               ?Using medication without problems or lightheadedness: yes ?Chest pain with exertion:no ?Edema:no ?Short of breath:no ?  ?Elevated Cholesterol: ?Using medications without problems: yes ?Muscle aches: some cramping but not with exertion.   ?Diet compliance: encouraged.   ?Exercise: encouraged ?  ?Mood is good.  Compliant with citalopram.  Doing well.  Job change helped prev.   ?  ?He still has occ flare with L sided flank pain, has been going on for years w/o ominous pathology/findings.  Still unpredictable in terms of onset but manageable.  Discussed previous work-up.  I presume that he has colonic spasm/functional issue that is not an obvious anatomic issue, meaning it would not show up on the typical imaging such as CT scan.  The problem is that when he does have symptoms, they resolve before any abortive medication would likely be useful.  Discussed.  He agrees. He wasn't at the point of wanting to try a preventive med.   ?  ?Insomnia.  Rare use of ambien.  No ADE on med.  He is working from home and his job situation is clearly better.   ?  ?His daughter is  attending [employer] and doing well.   ? ?PMH and SH reviewed ? ?Meds, vitals, and allergies reviewed.  ? ?ROS: Per HPI.  Unless specifically indicated otherwise in HPI, the patient denies: ? ?General: fever. ?Eyes: acute vision changes ?ENT: sore throat ?Cardiovascular: chest pain ?Respiratory: SOB ?GI: vomiting ?GU: dysuria ?Musculoskeletal: acute back pain ?Derm: acute rash ?Neuro: acute motor dysfunction ?Psych: worsening mood ?Endocrine: polydipsia ?Heme: bleeding ?Allergy: hayfever ? ?GEN: nad, alert and oriented ?HEENT: ncat ?NECK: supple w/o LA ?CV: rrr. ?PULM: ctab, no inc wob ?ABD: soft, +bs ?EXT: no edema ?SKIN: no acute rash ?

## 2022-03-16 NOTE — Assessment & Plan Note (Signed)
?  Mood is good.  Compliant with citalopram.  Doing well.  Job change helped prev.   Continue citalopram. ?

## 2022-03-16 NOTE — Assessment & Plan Note (Signed)
Rare use of ambien.  No ADE on med.  He is working from home and his job situation is clearly better.    Continue.  Use of Ambien. ?

## 2022-03-16 NOTE — Assessment & Plan Note (Signed)
Continue valsartan.  Continue work on diet and exercise. ?

## 2022-03-16 NOTE — Assessment & Plan Note (Signed)
Living will d/w pt.  Would have his sister Alan Ripper designated if patient were incapacitated. ?

## 2022-03-16 NOTE — Assessment & Plan Note (Signed)
Tetanus 2013 ?PNA due at 66 ?Shingles shot d/w pt.  ?Flu shot d/w pt.  ?Covid vaccine 2021 ?Colonoscopy 2020 ?PSA wnl. ?Living will d/w pt.  Would have his sister Alan Ripper designated if patient were incapacitated. ?Diet and exercise d/w pt.  Encouraged both ?HCV and HIV screening neg 2018. ?  ?

## 2022-03-16 NOTE — Assessment & Plan Note (Signed)
He still has occ flare with L sided flank pain, has been going on for years w/o ominous pathology/findings.  Still unpredictable in terms of onset but manageable.  Discussed previous work-up.  I presume that he has colonic spasm/functional issue that is not an obvious anatomic issue, meaning it would not show up on the typical imaging such as CT scan.  The problem is that when he does have symptoms, they resolve before any abortive medication would likely be useful.  Discussed.  He agrees. He wasn't at the point of wanting to try a preventive med.  He can update me as needed.  He agrees with plan. ?

## 2022-03-16 NOTE — Assessment & Plan Note (Signed)
Continue simvastatin.  Continue work on diet and exercise. 

## 2022-03-16 NOTE — Assessment & Plan Note (Signed)
TSH wnl.  Still on levothyroxine.  No ADE on med.  No neck mass or lump.  No dysphagia.  Continue levothyroxine as is. ?

## 2022-04-03 ENCOUNTER — Telehealth: Payer: Self-pay | Admitting: Internal Medicine

## 2022-04-03 NOTE — Telephone Encounter (Signed)
Made 3 attempts to schedule, removing from recall list. 

## 2022-04-07 ENCOUNTER — Telehealth: Payer: Self-pay | Admitting: Family Medicine

## 2022-04-07 NOTE — Telephone Encounter (Signed)
Patient asked to take a two week hiatus on simvastatin (ZOCOR) 20 MG tablet  Patient states he feels about the same, perhaps a little better, hard to tell  Please call patient at [phone]

## 2022-04-08 NOTE — Telephone Encounter (Signed)
Patient notified to stay off medication x 1 more week and let us know how that goes. Patient verbalized understanding.

## 2022-04-08 NOTE — Telephone Encounter (Signed)
I would give it 1 more week off medication and see if he notices a difference.  Please update [place] at that point.  Thanks.

## 2022-05-02 ENCOUNTER — Other Ambulatory Visit: Payer: Self-pay | Admitting: Family Medicine

## 2022-05-13 ENCOUNTER — Encounter: Payer: Self-pay | Admitting: Nurse Practitioner

## 2022-05-13 ENCOUNTER — Ambulatory Visit (INDEPENDENT_AMBULATORY_CARE_PROVIDER_SITE_OTHER): Payer: BC Managed Care – PPO | Admitting: Nurse Practitioner

## 2022-05-13 VITALS — BP 130/70 | HR 68 | Ht 68.0 in | Wt 197.8 lb

## 2022-05-13 DIAGNOSIS — I10 Essential (primary) hypertension: Secondary | ICD-10-CM | POA: Diagnosis not present

## 2022-05-13 DIAGNOSIS — K219 Gastro-esophageal reflux disease without esophagitis: Secondary | ICD-10-CM

## 2022-05-13 DIAGNOSIS — R072 Precordial pain: Secondary | ICD-10-CM | POA: Diagnosis not present

## 2022-05-13 DIAGNOSIS — E782 Mixed hyperlipidemia: Secondary | ICD-10-CM

## 2022-05-30 ENCOUNTER — Other Ambulatory Visit: Payer: Self-pay | Admitting: Family Medicine

## 2022-05-30 ENCOUNTER — Telehealth: Payer: Self-pay | Admitting: Family Medicine

## 2022-05-30 DIAGNOSIS — E039 Hypothyroidism, unspecified: Secondary | ICD-10-CM

## 2022-05-30 MED ORDER — LEVOTHYROXINE SODIUM 112 MCG PO TABS: ORAL_TABLET | ORAL | 3 refills | Status: DC

## 2022-05-30 NOTE — Telephone Encounter (Signed)
Patient called with question about his synthroid medication. He takes brand name right now and wants to know if he can take the generic for it. He said his insurance wont cover the brand anymore and pharmacy told him to confirm with PCP before switching. Call back is [phone]

## 2022-05-30 NOTE — Telephone Encounter (Signed)
Should be okay as long as no change in dose.  Would recheck TSH 2 months after the change to make sure he is still in range.  Let me know if new rx needed.  I put in the follow up order.  Thanks.

## 2022-05-30 NOTE — Telephone Encounter (Signed)
Spoke to pt. He will call back to schedule the non-fasting TSH lab when he gets his schedule for work. I sent the rx again even tough it does not say DAW, just to make sure.

## 2022-05-30 NOTE — Addendum Note (Signed)
Addended by: Tonia Ghent on: 05/30/2022 02:02 PM   Modules accepted: Orders

## 2022-05-30 NOTE — Addendum Note (Signed)
Addended by: Pilar Grammes on: 05/30/2022 05:01 PM   Modules accepted: Orders

## 2022-06-02 NOTE — Telephone Encounter (Signed)
Insurance now requires brand name; okay to change?

## 2022-06-03 NOTE — Telephone Encounter (Signed)
Sent. Thanks.   

## 2022-07-14 ENCOUNTER — Other Ambulatory Visit: Payer: Self-pay | Admitting: Family Medicine

## 2022-08-05 ENCOUNTER — Other Ambulatory Visit (INDEPENDENT_AMBULATORY_CARE_PROVIDER_SITE_OTHER): Payer: BC Managed Care – PPO

## 2022-08-05 DIAGNOSIS — E039 Hypothyroidism, unspecified: Secondary | ICD-10-CM | POA: Diagnosis not present

## 2022-08-05 LAB — TSH: TSH: 1.44 u[IU]/mL (ref 0.35–5.50)

## 2023-01-21 ENCOUNTER — Telehealth: Payer: Self-pay

## 2023-01-21 MED ORDER — LEVOTHYROXINE SODIUM 112 MCG PO TABS: ORAL_TABLET | ORAL | 0 refills | Status: DC

## 2023-01-21 MED ORDER — SIMVASTATIN 20 MG PO TABS: ORAL_TABLET | ORAL | 0 refills | Status: DC

## 2023-01-21 MED ORDER — VALSARTAN 80 MG PO TABS: 80.0000 mg | ORAL_TABLET | Freq: Every day | ORAL | 3 refills | Status: DC

## 2023-01-21 NOTE — Telephone Encounter (Signed)
Patient scheduled.

## 2023-01-21 NOTE — Telephone Encounter (Signed)
Erxs sent

## 2023-01-21 NOTE — Telephone Encounter (Signed)
Patient needs CPE scheduled for April. Please call patient to schedule.

## 2023-01-21 NOTE — Telephone Encounter (Signed)
Received refill requests from [facility] for simvastatin, valsartan and levothyroxine. Will send in after patient is called for appt.

## 2023-02-18 ENCOUNTER — Other Ambulatory Visit: Payer: Self-pay | Admitting: Family Medicine

## 2023-02-22 ENCOUNTER — Other Ambulatory Visit: Payer: Self-pay | Admitting: Family Medicine

## 2023-02-22 DIAGNOSIS — I10 Essential (primary) hypertension: Secondary | ICD-10-CM

## 2023-02-22 DIAGNOSIS — E039 Hypothyroidism, unspecified: Secondary | ICD-10-CM

## 2023-02-22 DIAGNOSIS — Z125 Encounter for screening for malignant neoplasm of prostate: Secondary | ICD-10-CM

## 2023-03-10 ENCOUNTER — Other Ambulatory Visit: Payer: BC Managed Care – PPO

## 2023-03-11 ENCOUNTER — Other Ambulatory Visit (INDEPENDENT_AMBULATORY_CARE_PROVIDER_SITE_OTHER): Payer: BC Managed Care – PPO

## 2023-03-11 DIAGNOSIS — E039 Hypothyroidism, unspecified: Secondary | ICD-10-CM

## 2023-03-11 DIAGNOSIS — I10 Essential (primary) hypertension: Secondary | ICD-10-CM | POA: Diagnosis not present

## 2023-03-11 DIAGNOSIS — Z125 Encounter for screening for malignant neoplasm of prostate: Secondary | ICD-10-CM | POA: Diagnosis not present

## 2023-03-11 LAB — COMPREHENSIVE METABOLIC PANEL
ALT: 31 U/L (ref 0–53)
AST: 21 U/L (ref 0–37)
Albumin: 4.1 g/dL (ref 3.5–5.2)
Alkaline Phosphatase: 66 U/L (ref 39–117)
BUN: 15 mg/dL (ref 6–23)
CO2: 30 mEq/L (ref 19–32)
Calcium: 9.3 mg/dL (ref 8.4–10.5)
Chloride: 104 mEq/L (ref 96–112)
Creatinine, Ser: 0.96 mg/dL (ref 0.40–1.50)
GFR: 82.77 mL/min (ref >60.00)
Glucose, Bld: 91 mg/dL (ref 70–99)
Potassium: 4.4 mEq/L (ref 3.5–5.1)
Sodium: 140 mEq/L (ref 135–145)
Total Bilirubin: 0.7 mg/dL (ref 0.2–1.2)
Total Protein: 6.6 g/dL (ref 6.0–8.3)

## 2023-03-11 LAB — TSH: TSH: 3.35 u[IU]/mL (ref 0.35–5.50)

## 2023-03-11 LAB — LIPID PANEL
Cholesterol: 140 mg/dL (ref 0–200)
HDL: 36.1 mg/dL — ABNORMAL LOW (ref >39.00)
LDL Cholesterol: 77 mg/dL (ref 0–99)
NonHDL: 103.47
Total CHOL/HDL Ratio: 4
Triglycerides: 133 mg/dL (ref 0.0–149.0)
VLDL: 26.6 mg/dL (ref 0.0–40.0)

## 2023-03-11 LAB — PSA: PSA: 1.93 ng/mL (ref 0.10–4.00)

## 2023-03-17 ENCOUNTER — Ambulatory Visit (INDEPENDENT_AMBULATORY_CARE_PROVIDER_SITE_OTHER): Payer: Medicare Other | Admitting: Family Medicine

## 2023-03-17 ENCOUNTER — Encounter: Payer: Self-pay | Admitting: Family Medicine

## 2023-03-17 VITALS — BP 116/60 | HR 73 | Temp 97.5°F | Ht 68.0 in | Wt 195.0 lb

## 2023-03-17 DIAGNOSIS — G4700 Insomnia, unspecified: Secondary | ICD-10-CM

## 2023-03-17 DIAGNOSIS — I10 Essential (primary) hypertension: Secondary | ICD-10-CM

## 2023-03-17 DIAGNOSIS — E039 Hypothyroidism, unspecified: Secondary | ICD-10-CM

## 2023-03-17 DIAGNOSIS — E785 Hyperlipidemia, unspecified: Secondary | ICD-10-CM

## 2023-03-17 DIAGNOSIS — F419 Anxiety disorder, unspecified: Secondary | ICD-10-CM | POA: Diagnosis not present

## 2023-03-17 DIAGNOSIS — Z0000 Encounter for general adult medical examination without abnormal findings: Secondary | ICD-10-CM

## 2023-03-17 DIAGNOSIS — F32A Depression, unspecified: Secondary | ICD-10-CM

## 2023-03-17 DIAGNOSIS — R1012 Left upper quadrant pain: Secondary | ICD-10-CM

## 2023-03-17 DIAGNOSIS — Z7189 Other specified counseling: Secondary | ICD-10-CM

## 2023-03-17 DIAGNOSIS — N529 Male erectile dysfunction, unspecified: Secondary | ICD-10-CM

## 2023-03-17 MED ORDER — VALSARTAN 80 MG PO TABS: 80.0000 mg | ORAL_TABLET | Freq: Every day | ORAL | 3 refills | Status: DC

## 2023-03-17 MED ORDER — SIMVASTATIN 20 MG PO TABS: ORAL_TABLET | ORAL | 3 refills | Status: DC

## 2023-03-17 MED ORDER — DICLOFENAC SODIUM 75 MG PO TBEC: 75.0000 mg | DELAYED_RELEASE_TABLET | Freq: Two times a day (BID) | ORAL | 3 refills | Status: AC

## 2023-03-17 MED ORDER — LEVOTHYROXINE SODIUM 112 MCG PO TABS: ORAL_TABLET | ORAL | 3 refills | Status: DC

## 2023-03-17 MED ORDER — CITALOPRAM HYDROBROMIDE 20 MG PO TABS: 20.0000 mg | ORAL_TABLET | Freq: Every day | ORAL | 3 refills | Status: DC

## 2023-03-17 NOTE — Progress Notes (Unsigned)
I have personally reviewed the Medicare Annual Wellness questionnaire and have noted 1. The patient's medical and social history 2. Their use of alcohol, tobacco or illicit drugs 3. Their current medications and supplements 4. The patient's functional ability including ADL's, fall risks, home safety risks and hearing or visual             impairment. 5. Diet and physical activities 6. Evidence for depression or mood disorders  The patients weight, height, BMI have been recorded in the chart and visual acuity is per eye clinic.  I have made referrals, counseling and provided education to the patient based review of the above and I have provided the pt with a written personalized care plan for preventive services.  Provider list updated- see scanned forms.  Routine anticipatory guidance given to patient.  See health maintenance. The possibility exists that previously documented standard health maintenance information may have been brought forward from a previous encounter into this note.  If needed, that same information has been updated to reflect the current situation based on today's encounter.    Tetanus 2013 PNA due, d/w pt.   Shingles shot d/w pt.  Flu shot d/w pt.  Covid vaccine 2021 Colonoscopy 2020 PSA wnl. Living will d/w pt.  Would have his sister Duanne Guess designated if patient were incapacitated. Diet and exercise d/w pt.  Encouraged both HCV and HIV screening neg 2018. Cognitive function addressed- see scanned forms- and if abnormal then additional documentation follows.   Prev EKG in chart, reviewed.   Hearing and vision screening d/w pt.    In addition to [facility], follow up visit for the below conditions:  Hypothyroidism.  TSH wnl.  Still on levothyroxine.  No ADE on med.  No neck mass or lump.  No dysphagia.  ED d/w pt.  He'll consider options.  No NTG use.     Hypertension:               Using medication without problems or lightheadedness: yes Chest  pain with exertion:no Edema:no Short of breath:no   Elevated Cholesterol: Using medications without problems: yes Muscle aches: some cramping but not with exertion.  At rest, not daily.  In the feet.  Happens about twice a week.  No change with statin stop prev.  Discussed stretching.   Diet compliance: encouraged.   Exercise: encouraged   Mood is good.  Compliant with citalopram.  Doing well.  Job change helped prev.     He still has occ flare with L sided flank pain, has been going on for years w/o ominous pathology/findings.  More frequent than prev.  Still unpredictable in terms of onset but manageable.  Discussed previous work-up.     Insomnia.  Rare use of ambien.  No ADE on med.  He is working from home and his job situation had prev gotten better.  Doing contract work currently.    OSA, d/w pt about CPAP use.  He had difficulty with mask fit.  He isn't known to snore now.  Not waking gasping for air.    His daughter is still attending [employer] and doing well.    He found his neighbor deceased at home. D/w pt. this was a shocking situation but he has managed in the meantime.  I asked him to update me as needed.  PMH and SH reviewed  Meds, vitals, and allergies reviewed.   ROS: Per HPI.  Unless specifically indicated otherwise in HPI, the patient denies:  General:  fever. Eyes: acute vision changes ENT: sore throat Cardiovascular: chest pain Respiratory: SOB GI: vomiting GU: dysuria Musculoskeletal: acute back pain Derm: acute rash Neuro: acute motor dysfunction Psych: worsening mood Endocrine: polydipsia Heme: bleeding Allergy: hayfever  GEN: nad, alert and oriented HEENT: ncat NECK: supple w/o LA CV: rrr. PULM: ctab, no inc wob ABD: soft, +bs EXT: no edema SKIN: no acute rash

## 2023-03-17 NOTE — Patient Instructions (Addendum)
Check with your insurance/pharmacy to see if they will cover the shingles and tetanus shot. PNA 20 when possible.   I would get a flu shot each fall.   Take care.  Glad to see you.

## 2023-03-18 DIAGNOSIS — N529 Male erectile dysfunction, unspecified: Secondary | ICD-10-CM | POA: Insufficient documentation

## 2023-03-18 NOTE — Assessment & Plan Note (Signed)
Continue work on diet and exercise.  Continue simvastatin. 

## 2023-03-18 NOTE — Assessment & Plan Note (Signed)
Tetanus 2013 PNA due, d/w pt.   Shingles shot d/w pt.  Flu shot d/w pt.  Covid vaccine 2021 Colonoscopy 2020 PSA wnl. Living will d/w pt.  Would have his sister Duanne Guess designated if patient were incapacitated. Diet and exercise d/w pt.  Encouraged both HCV and HIV screening neg 2018.

## 2023-03-18 NOTE — Assessment & Plan Note (Signed)
I still presume that he has colonic spasm/functional issue that is not an obvious anatomic issue, meaning it would not show up on the typical imaging such as CT scan.  The problem is that when he does have symptoms, they resolve before any abortive medication would likely be useful.  Discussed.  He agrees. He wasn't at the point of wanting to try a preventive med.  No blood in stools. No black stools.  No vomiting.  He can update me as needed.

## 2023-03-18 NOTE — Assessment & Plan Note (Signed)
Doing well.  Continue citalopram.

## 2023-03-18 NOTE — Assessment & Plan Note (Signed)
Living will d/w pt. Would have his sister Micheal Jones designated if patient were incapacitated. 

## 2023-03-18 NOTE — Assessment & Plan Note (Signed)
No nitroglycerin use.  Discussed routine cautions with sildenafil.  He will consider if he wants to try that.  He can update me as needed.

## 2023-03-18 NOTE — Assessment & Plan Note (Signed)
TSH wnl.  Still on levothyroxine.  No ADE on med.  No neck mass or lump.  No dysphagia.  Continue levothyroxine.

## 2023-03-18 NOTE — Assessment & Plan Note (Signed)
Continue work on diet and exercise.  Continue valsartan.

## 2023-03-18 NOTE — Assessment & Plan Note (Signed)
Rare use of Ambien.  Continue as is.

## 2023-05-17 ENCOUNTER — Other Ambulatory Visit: Payer: Self-pay | Admitting: Family Medicine

## 2023-06-10 ENCOUNTER — Other Ambulatory Visit: Payer: Self-pay | Admitting: Family Medicine

## 2023-09-14 ENCOUNTER — Other Ambulatory Visit: Payer: Self-pay

## 2023-09-14 ENCOUNTER — Emergency Department
Admission: EM | Admit: 2023-09-14 | Discharge: 2023-09-14 | Disposition: A | Discharge status: Home / Self Care | Payer: BC Managed Care – PPO | Attending: Emergency Medicine | Admitting: Emergency Medicine

## 2023-09-14 DIAGNOSIS — S61411A Laceration without foreign body of right hand, initial encounter: Secondary | ICD-10-CM | POA: Insufficient documentation

## 2023-09-14 DIAGNOSIS — W268XXA Contact with other sharp object(s), not elsewhere classified, initial encounter: Secondary | ICD-10-CM | POA: Diagnosis not present

## 2023-09-14 DIAGNOSIS — I10 Essential (primary) hypertension: Secondary | ICD-10-CM | POA: Insufficient documentation

## 2023-09-14 DIAGNOSIS — S6991XA Unspecified injury of right wrist, hand and finger(s), initial encounter: Secondary | ICD-10-CM | POA: Diagnosis not present

## 2023-09-14 MED ORDER — LIDOCAINE HCL (PF) 1 % IJ SOLN
5.0000 mL | Freq: Once | INTRAMUSCULAR | Status: AC
  Administered 2023-09-14: 5 mL via INTRADERMAL
  Filled 2023-09-14: qty 5

## 2023-09-14 NOTE — ED Notes (Signed)
See triage note  Presents with laceration to right hand  States he was cleaning up in the garage  Caught his hand on so e shelving Laceration to back of hand   near the 5th finger

## 2023-09-14 NOTE — Discharge Instructions (Addendum)
Your stitches will need to be removed in 7 to 10 days.  This can be done by this emergency department, urgent care or your primary care provider.  Please keep the dressing applied today intact for 24 hours.  After that you can remove it and wash with soap and water and then reapply a bandage.  For best outcomes the wound needs to be clean and dry. Please keep the wound covered until you have the stitches removed, if the bandage gets wet please take it off and replaced with a dry 1.  Watch for signs of infection including redness, warmth, swelling, pain and pus drainage.  If you develop any of these please return to the ED, urgent care or your primary care provider.

## 2023-09-14 NOTE — ED Triage Notes (Signed)
Pt to ED for laceration to top of hand, cut on metal shelf while cleaning garage. Reports UTD on tetanus Bleeding controlled.

## 2023-09-14 NOTE — ED Provider Notes (Signed)
[facility] Provider Note    Event Date/Time   First MD Initiated Contact with Patient 09/14/23 1601     (approximate)   History   Laceration   HPI  Micheal Jones is a 67 y.o. male with PMH of hypertension, arthritis, GERD, anxiety and depression presents for evaluation of laceration.  Patient was working in his garage this morning when he reached forward cutting his hand on a stainless steel shelf.  Patient reports he was unable to get it to stop bleeding at home which is why he came to the ED.  Patient does not take any blood thinners.      Physical Exam   Triage Vital Signs: ED Triage Vitals  Encounter Vitals Group     BP 09/14/23 1405 (!) 143/82     Systolic BP Percentile --      Diastolic BP Percentile --      Pulse Rate 09/14/23 1405 83     Resp 09/14/23 1405 18     Temp 09/14/23 1405 98 F (36.7 C)     Temp src --      SpO2 09/14/23 1406 96 %     Weight 09/14/23 1406 198 lb (89.8 kg)     Height 09/14/23 1406 5\' 7"  (1.702 m)     Head Circumference --      Peak Flow --      Pain Score 09/14/23 1406 5     Pain Loc --      Pain Education --      Exclude from Growth Chart --     Most recent vital signs: Vitals:   09/14/23 1405 09/14/23 1406  BP: (!) 143/82   Pulse: 83   Resp: 18   Temp: 98 F (36.7 C)   SpO2:  96%   General: Awake, no distress.  CV:  Good peripheral perfusion.  Resp:  Normal effort.  Abd:  No distention.  Other:  Approximately 3 cm laceration to the dorsal side of patient's hand, bleeding controlled.   ED Results / Procedures / Treatments   Labs (all labs ordered are listed, but only abnormal results are displayed) Labs Reviewed - No data to display    PROCEDURES:  Critical Care performed: No  ..Laceration Repair  Date/Time: 09/14/2023 5:12 PM  Performed by: Cameron Ali, PA-C Authorized by: Cameron Ali, PA-C   Consent:    Consent obtained:  Verbal   Consent given by:   Patient   Risks, benefits, and alternatives were discussed: yes     Risks discussed:  Infection, pain, poor cosmetic result and poor wound healing   Alternatives discussed:  No treatment Universal protocol:    Patient identity confirmed:  Verbally with patient Anesthesia:    Anesthesia method:  Local infiltration   Local anesthetic:  Lidocaine 1% w/o epi Laceration details:    Location:  Hand   Hand location:  R hand, dorsum   Length (cm):  3   Depth (mm):  3 Pre-procedure details:    Preparation:  Patient was prepped and draped in usual sterile fashion Exploration:    Hemostasis achieved with:  Direct pressure   Wound exploration: wound explored through full range of motion and entire depth of wound visualized   Treatment:    Area cleansed with:  Povidone-iodine   Amount of cleaning:  Standard   Irrigation solution:  Sterile saline   Irrigation method:  Syringe Skin repair:    Repair method:  Sutures  Suture size:  5-0   Suture material:  Plain gut   Suture technique:  Running locked   Number of sutures:  1 Approximation:    Approximation:  Close Repair type:    Repair type:  Simple Post-procedure details:    Dressing:  Bulky dressing   Procedure completion:  Tolerated well, no immediate complications    MEDICATIONS ORDERED IN ED: Medications  lidocaine (PF) (XYLOCAINE) 1 % injection 5 mL (has no administration in time range)     IMPRESSION / MDM / ASSESSMENT AND PLAN / ED COURSE  I reviewed the triage vital signs and the nursing notes.                             67 year old male presents for evaluation of laceration to the right hand.  Patient was hypertensive in triage otherwise vital signs stable.  Patient NAD on exam.  Differential diagnosis includes, but is not limited to, laceration.  Patient's presentation is most consistent with acute, uncomplicated illness.  Laceration repaired as described in the procedure note above.  Patient was instructed on  wound care.  He will need to have the stitches removed in 7 to 10 days.  He can take Tylenol and ibuprofen as needed for pain.  He voiced understanding, all questions were answered and he was stable at discharge.    FINAL CLINICAL IMPRESSION(S) / ED DIAGNOSES   Final diagnoses:  Laceration of right hand without foreign body, initial encounter     Rx / DC Orders   ED Discharge Orders     None        Note:  This document was prepared using Dragon voice recognition software and may include unintentional dictation errors.   Cameron Ali, PA-C 09/14/23 1750    Minna Antis, MD 09/14/23 1940

## 2023-09-15 ENCOUNTER — Telehealth: Payer: Self-pay | Admitting: Family Medicine

## 2023-09-15 NOTE — Telephone Encounter (Signed)
Pt called stating yesterday, 10/28, he cut the top of his hand with screw & visited the ER. Pt states he was asking during his ER visit, had he received a Hep shot recently? Pt states he wasn't sure but he remembers being told by [place] during his cpe on 03/17/23, that his shots were up to date. Pt is confirming that he's up tot that date on the hep shot? Pt requested a response today so he can quickly get the shot. Pt also mentioned he received stiches on hand from cut. Call back # [phone]

## 2023-09-16 NOTE — Telephone Encounter (Signed)
Called patient verified that he was asking about TD. He is. Patient was due for last year. Because he has had injury he can get in our office as nurse visit right? If so I will call and set up time for him to come by and get. Sending to a provider in office to review. Patients pcp not in office.

## 2023-09-16 NOTE — Telephone Encounter (Signed)
It looks like he is due for tetanus shot- I don't see tetanus injection done at recent ER eval.  Thanks.

## 2023-09-16 NOTE — Telephone Encounter (Addendum)
Left v/m requesting pt to call for [place] appt for TD. Sending note to [place] pool

## 2023-09-16 NOTE — Telephone Encounter (Signed)
Yes, patient is due for TD, especially given his injury. Okay to proceed with TD vaccine as a nurse visit.

## 2023-09-17 ENCOUNTER — Ambulatory Visit (INDEPENDENT_AMBULATORY_CARE_PROVIDER_SITE_OTHER): Payer: BC Managed Care – PPO

## 2023-09-17 DIAGNOSIS — Z23 Encounter for immunization: Secondary | ICD-10-CM

## 2023-09-17 DIAGNOSIS — S61411A Laceration without foreign body of right hand, initial encounter: Secondary | ICD-10-CM | POA: Diagnosis not present

## 2023-09-17 NOTE — Progress Notes (Signed)
Patient presented for TD vaccine due to hand injury given by Wendie Simmer, CMA to left deltoid, patient voiced no concerns nor showed any signs of distress during injection.

## 2023-09-17 NOTE — Telephone Encounter (Signed)
Please try to contact patient to schedule him for a tetanus shot on a nurse visit. Thank you

## 2023-09-28 ENCOUNTER — Ambulatory Visit (INDEPENDENT_AMBULATORY_CARE_PROVIDER_SITE_OTHER): Payer: BC Managed Care – PPO | Admitting: Family Medicine

## 2023-09-28 ENCOUNTER — Encounter: Payer: Self-pay | Admitting: Family Medicine

## 2023-09-28 VITALS — BP 128/74 | HR 77 | Temp 99.1°F | Ht 67.0 in | Wt 195.4 lb

## 2023-09-28 DIAGNOSIS — Z4802 Encounter for removal of sutures: Secondary | ICD-10-CM | POA: Diagnosis not present

## 2023-09-28 NOTE — Progress Notes (Unsigned)
Suture removal.  Had cut his hand on a steel shelf.  Single running locked suture.  Tdap 2024. Dorsum R 4th finger and hand.  Normal range of motion.  No fevers.  No purulent discharge.  Needs suture removal.  He consented for that.  Meds, vitals, and allergies reviewed.   ROS: Per HPI unless specifically indicated in ROS section   Nad Ncat Single running suture on dorsum R 4th finger and hand.  No spreading erythema.  No purulent discharge.  Good tissue apposition.  Normal range of motion on the hand. Suture removed without complication under magnification.  Benzoin and Steri-Strips applied per routine without complication.  Tolerated well.

## 2023-09-28 NOTE — Patient Instructions (Signed)
Keep the area cleaned and update me as needed.  The strips should gradually curl up.  Trim the edges as needed.  Take care.  Glad to see you.

## 2023-09-30 DIAGNOSIS — Z4802 Encounter for removal of sutures: Secondary | ICD-10-CM | POA: Insufficient documentation

## 2023-09-30 NOTE — Assessment & Plan Note (Signed)
See above.  Tolerated well.  I asked him to keep the area cleaned and update me as needed.  The strips should gradually curl up.  Trim the edges as needed.

## 2024-02-24 ENCOUNTER — Other Ambulatory Visit: Payer: Self-pay | Admitting: Family Medicine

## 2024-03-24 ENCOUNTER — Other Ambulatory Visit: Payer: Self-pay | Admitting: Family Medicine

## 2024-03-25 NOTE — Telephone Encounter (Signed)
 LOV: 09/28/23 NWG:NFAOZHY SCHEDULED LAST REFILL:citalopram  (CELEXA ) 20 MG tablet 90 tablets 3 refills

## 2024-03-31 ENCOUNTER — Other Ambulatory Visit: Payer: Self-pay | Admitting: Family Medicine

## 2024-04-27 ENCOUNTER — Other Ambulatory Visit: Payer: Self-pay | Admitting: Family Medicine

## 2024-06-03 ENCOUNTER — Other Ambulatory Visit: Payer: Self-pay | Admitting: Family Medicine

## 2024-08-09 ENCOUNTER — Telehealth: Payer: Self-pay

## 2024-08-09 NOTE — Telephone Encounter (Signed)
 Lvm to schedule for lab apt one week prior to physical

## 2024-08-09 NOTE — Telephone Encounter (Signed)
 Copied from CRM [id number]. Topic: Clinical - Request for Lab/Test Order >> Aug 09, 2024 10:56 AM Revonda D wrote: Reason for CRM: Pt is requesting lab orders for his physical appt. Pt would like to come in to have the labs done before the physical and would like a callback to get the appt scheduled.   Please call to set up physical appointment

## 2024-08-14 ENCOUNTER — Other Ambulatory Visit: Payer: Self-pay | Admitting: Family Medicine

## 2024-08-14 DIAGNOSIS — E039 Hypothyroidism, unspecified: Secondary | ICD-10-CM

## 2024-08-14 DIAGNOSIS — Z125 Encounter for screening for malignant neoplasm of prostate: Secondary | ICD-10-CM

## 2024-08-14 DIAGNOSIS — E785 Hyperlipidemia, unspecified: Secondary | ICD-10-CM

## 2024-08-16 ENCOUNTER — Other Ambulatory Visit: Payer: Self-pay | Admitting: Family Medicine

## 2024-08-17 NOTE — Telephone Encounter (Signed)
 Sent. Thanks.

## 2024-08-17 NOTE — Telephone Encounter (Signed)
 LOV: 09/28/2023 NOV: 08/25/24 Last Refill: citalopram  (CELEXA ) 20 MG tablet  03/27/2024 90 tablets 1 refill

## 2024-08-19 ENCOUNTER — Other Ambulatory Visit (INDEPENDENT_AMBULATORY_CARE_PROVIDER_SITE_OTHER)

## 2024-08-19 DIAGNOSIS — E785 Hyperlipidemia, unspecified: Secondary | ICD-10-CM

## 2024-08-19 DIAGNOSIS — Z125 Encounter for screening for malignant neoplasm of prostate: Secondary | ICD-10-CM | POA: Diagnosis not present

## 2024-08-19 DIAGNOSIS — E039 Hypothyroidism, unspecified: Secondary | ICD-10-CM

## 2024-08-19 LAB — COMPREHENSIVE METABOLIC PANEL WITH GFR
ALT: 25 U/L (ref 0–53)
AST: 18 U/L (ref 0–37)
Albumin: 4.3 g/dL (ref 3.5–5.2)
Alkaline Phosphatase: 71 U/L (ref 39–117)
BUN: 16 mg/dL (ref 6–23)
CO2: 28 meq/L (ref 19–32)
Calcium: 9.2 mg/dL (ref 8.4–10.5)
Chloride: 103 meq/L (ref 96–112)
Creatinine, Ser: 0.97 mg/dL (ref 0.40–1.50)
GFR: 80.92 mL/min (ref >60.00)
Glucose, Bld: 98 mg/dL (ref 70–99)
Potassium: 4.1 meq/L (ref 3.5–5.1)
Sodium: 141 meq/L (ref 135–145)
Total Bilirubin: 0.9 mg/dL (ref 0.2–1.2)
Total Protein: 7 g/dL (ref 6.0–8.3)

## 2024-08-19 LAB — LIPID PANEL
Cholesterol: 151 mg/dL (ref 0–200)
HDL: 36.1 mg/dL — ABNORMAL LOW (ref >39.00)
LDL Cholesterol: 98 mg/dL (ref 0–99)
NonHDL: 114.84
Total CHOL/HDL Ratio: 4
Triglycerides: 83 mg/dL (ref 0.0–149.0)
VLDL: 16.6 mg/dL (ref 0.0–40.0)

## 2024-08-19 LAB — PSA: PSA: 1.86 ng/mL (ref 0.10–4.00)

## 2024-08-19 LAB — TSH: TSH: 4.73 u[IU]/mL (ref 0.35–5.50)

## 2024-08-21 ENCOUNTER — Ambulatory Visit: Payer: Self-pay | Admitting: Family Medicine

## 2024-08-25 ENCOUNTER — Encounter: Payer: Self-pay | Admitting: Family Medicine

## 2024-08-25 ENCOUNTER — Ambulatory Visit: Admitting: Family Medicine

## 2024-08-25 VITALS — BP 132/82 | HR 66 | Temp 98.6°F | Ht 68.19 in | Wt 202.8 lb

## 2024-08-25 DIAGNOSIS — R1310 Dysphagia, unspecified: Secondary | ICD-10-CM

## 2024-08-25 DIAGNOSIS — G4733 Obstructive sleep apnea (adult) (pediatric): Secondary | ICD-10-CM | POA: Diagnosis not present

## 2024-08-25 DIAGNOSIS — R351 Nocturia: Secondary | ICD-10-CM

## 2024-08-25 DIAGNOSIS — Z7189 Other specified counseling: Secondary | ICD-10-CM | POA: Diagnosis not present

## 2024-08-25 DIAGNOSIS — Z0000 Encounter for general adult medical examination without abnormal findings: Secondary | ICD-10-CM

## 2024-08-25 DIAGNOSIS — F32A Depression, unspecified: Secondary | ICD-10-CM

## 2024-08-25 DIAGNOSIS — R1012 Left upper quadrant pain: Secondary | ICD-10-CM

## 2024-08-25 DIAGNOSIS — E039 Hypothyroidism, unspecified: Secondary | ICD-10-CM

## 2024-08-25 DIAGNOSIS — G4700 Insomnia, unspecified: Secondary | ICD-10-CM

## 2024-08-25 DIAGNOSIS — F419 Anxiety disorder, unspecified: Secondary | ICD-10-CM

## 2024-08-25 DIAGNOSIS — E785 Hyperlipidemia, unspecified: Secondary | ICD-10-CM

## 2024-08-25 DIAGNOSIS — I10 Essential (primary) hypertension: Secondary | ICD-10-CM

## 2024-08-25 MED ORDER — ZOLPIDEM TARTRATE 10 MG PO TABS: 5.0000 mg | ORAL_TABLET | Freq: Every evening | ORAL | 1 refills | Status: AC | PRN

## 2024-08-25 MED ORDER — LEVOTHYROXINE SODIUM 112 MCG PO TABS: 112.0000 ug | ORAL_TABLET | Freq: Every day | ORAL | 3 refills | Status: AC

## 2024-08-25 MED ORDER — VALSARTAN 80 MG PO TABS: 80.0000 mg | ORAL_TABLET | Freq: Every day | ORAL | 3 refills | Status: AC

## 2024-08-25 MED ORDER — SIMVASTATIN 20 MG PO TABS: 20.0000 mg | ORAL_TABLET | Freq: Every day | ORAL | 3 refills | Status: AC

## 2024-08-25 NOTE — Progress Notes (Signed)
 Tetanus 2024 PNA due, d/w pt.   Shingles shot d/w pt.  Flu shot d/w pt.  Covid vaccine 2021 Colonoscopy 2020 PSA wnl 2025 Living will d/w pt.  Would have his sister Ginnie Pines designated if patient were incapacitated. Diet and exercise d/w pt.  Encouraged both HCV and HIV screening neg 2018.  He has occ sx with food sticking at the lower esophagus.  More sx if not drinking fluid.  No choking, can still talk with events.  This is long standing.  D/w pt about GI f/u is progressive sx. No vomiting, no black stools. Cautions d/w pt about eating fast.    Nocturia.  D/w pt about options.  PSA wnl.  Not bothersome enough to take flomax.    Episode of syncope about 4 years ago.  No syncope in years.  Then another separate/different event a few weeks ago.  He was getting out of bed about 3 AM, likely in the midst of a dream, stood up and then went to bathroom.  No syncope or lightheadedness.  He had not used ambien  that night.  No syncope otherwise.  Routine cautions given to patient.  H/o insomnia.  Rare use of ambien .  No ADE on med when used.   Hypothyroidism.  TSH wnl.  Still on levothyroxine .  No ADE on med.  No neck mass or lump.     Hypertension:               Using medication without problems or lightheadedness: yes Chest pain with exertion:no Edema:no Short of breath:no   Elevated Cholesterol: Using medications without problems: yes Muscle aches: some cramping but not with exertion.  At rest, not daily.  In the feet, rarely in the hands.  Rare events.  No change with statin stop prev.  Discussed stretching.   Diet compliance: encouraged.   Exercise: encouraged   Mood is good.  Compliant with citalopram .  Doing well.  Job change helped prev.  No SI/HI.     He still has occ flare with L sided flank pain, has been going on for years w/o ominous pathology/findings.  Some days without sx. H/o unpredictable in terms of onset but manageable.  Discussed previous work-up.    H/o OSA but  he isn't known to snore now.  Not waking gasping for air. He hasn't been able to tolerate CPAP in the past.  D/w pt about getting repeat sleep study done through pulmonary.    His daughter is still attending [employer] and doing well.    Meds, vitals, and allergies reviewed.   ROS: Per HPI unless specifically indicated in ROS section   GEN: nad, alert and oriented HEENT: mucous membranes moist NECK: supple w/o LA CV: rrr. PULM: ctab, no inc wob ABD: soft, +bs EXT: no edema SKIN: no acute rash but benign appearing SKs on the upper chest.

## 2024-08-25 NOTE — Patient Instructions (Addendum)
 Let me know if want or need to see GI.  Take care.  Glad to see you. PNA 20 and shingles shots when possible.  Refer to pulmonary.  I would get a flu shot each fall.

## 2024-08-28 DIAGNOSIS — R1310 Dysphagia, unspecified: Secondary | ICD-10-CM | POA: Insufficient documentation

## 2024-08-28 DIAGNOSIS — Z0000 Encounter for general adult medical examination without abnormal findings: Secondary | ICD-10-CM | POA: Insufficient documentation

## 2024-08-28 DIAGNOSIS — R351 Nocturia: Secondary | ICD-10-CM | POA: Insufficient documentation

## 2024-08-28 NOTE — Assessment & Plan Note (Signed)
 Continue work on diet and exercise.  Continue simvastatin .

## 2024-08-28 NOTE — Assessment & Plan Note (Signed)
 Would continue citalopram  as is.

## 2024-08-28 NOTE — Assessment & Plan Note (Signed)
 Tetanus 2024 PNA due, d/w pt.   Shingles shot d/w pt.  Flu shot d/w pt.  Covid vaccine 2021 Colonoscopy 2020 PSA wnl 2025 Living will d/w pt.  Would have his sister Ginnie Pines designated if patient were incapacitated. Diet and exercise d/w pt.  Encouraged both HCV and HIV screening neg 2018.

## 2024-08-28 NOTE — Assessment & Plan Note (Signed)
Continue work on diet and exercise.  Continue valsartan.

## 2024-08-28 NOTE — Assessment & Plan Note (Signed)
 He has occ sx with food sticking at the lower esophagus.  More sx if not drinking fluid.  No choking, can still talk with events.  This is long standing.  D/w pt about GI f/u if progressive sx-he can update me as needed. No vomiting, no black stools. Cautions d/w pt about eating fast.

## 2024-08-28 NOTE — Assessment & Plan Note (Signed)
 D/w pt about options.  PSA wnl.  Not bothersome enough for patient to take flomax.   He can update me as needed.

## 2024-08-28 NOTE — Assessment & Plan Note (Signed)
 TSH wnl.  Still on levothyroxine .  No ADE on med.  No neck mass or lump.  Continue levothyroxine  as is.

## 2024-08-28 NOTE — Assessment & Plan Note (Signed)
 I suspect he has benign GI source for his intermittent symptoms.  I asked him to update me as needed.

## 2024-08-28 NOTE — Assessment & Plan Note (Signed)
Living will d/w pt. Would have his sister Sally Greene designated if patient were incapacitated. 

## 2024-08-28 NOTE — Assessment & Plan Note (Signed)
 Rare use of ambien .  No ADE on med when used.  Continue as needed use.  Update me as needed.

## 2024-08-28 NOTE — Assessment & Plan Note (Signed)
 Refer back to pulmonary

## 2024-12-02 ENCOUNTER — Telehealth: Payer: Self-pay | Admitting: Sleep Medicine

## 2024-12-02 NOTE — Telephone Encounter (Signed)
 [facility] to schedule sleep consult. Copied from CRM #[id number]. Topic: Appointments - Scheduling Inquiry for Clinic >> Dec 02, 2024 12:43 PM Micheal Jones wrote: Reason for CRM: patient is calling cause he is just now hearing a message that the clinic was trying to get him scheduled. Tried calling cal but no answer. The referral is closed so I'm not able to schedule . Please give patient a call to get him scheduled  [phone]

## 2024-12-19 ENCOUNTER — Ambulatory Visit: Admitting: Sleep Medicine

## 2024-12-23 NOTE — Telephone Encounter (Signed)
 Patient scheduled 12/28/2024- nothing further needed

## 2024-12-27 ENCOUNTER — Ambulatory Visit: Admitting: Sleep Medicine

## 2024-12-28 ENCOUNTER — Ambulatory Visit: Admitting: Sleep Medicine
